# Patient Record
Sex: Male | Born: 1945 | Race: White | Hispanic: No | Marital: Married | Smoking: Former smoker
Health system: Southern US, Community
[De-identification: ages and names within clinical notes are randomized; demographics above are authoritative.]

## PROBLEM LIST (undated history)

## (undated) DIAGNOSIS — R112 Nausea with vomiting, unspecified: Secondary | ICD-10-CM

## (undated) DIAGNOSIS — R569 Unspecified convulsions: Secondary | ICD-10-CM

## (undated) DIAGNOSIS — Z8719 Personal history of other diseases of the digestive system: Secondary | ICD-10-CM

## (undated) DIAGNOSIS — K529 Noninfective gastroenteritis and colitis, unspecified: Secondary | ICD-10-CM

## (undated) DIAGNOSIS — G40309 Generalized idiopathic epilepsy and epileptic syndromes, not intractable, without status epilepticus: Secondary | ICD-10-CM

## (undated) HISTORY — DX: Nausea with vomiting, unspecified: R11.2

## (undated) HISTORY — DX: Generalized idiopathic epilepsy and epileptic syndromes, not intractable, without status epilepticus: G40.309

## (undated) HISTORY — DX: Personal history of other diseases of the digestive system: Z87.19

---

## 2005-09-07 ENCOUNTER — Encounter: Payer: Self-pay | Admitting: Internal Medicine

## 2005-09-30 ENCOUNTER — Encounter: Payer: Self-pay | Admitting: Internal Medicine

## 2006-12-31 ENCOUNTER — Emergency Department: Payer: Self-pay

## 2007-08-01 ENCOUNTER — Emergency Department (HOSPITAL_COMMUNITY): Admission: EM | Admit: 2007-08-01 | Discharge: 2007-08-01 | Payer: Self-pay | Admitting: Emergency Medicine

## 2007-09-22 ENCOUNTER — Ambulatory Visit: Payer: Self-pay | Admitting: Internal Medicine

## 2008-01-22 ENCOUNTER — Ambulatory Visit: Payer: Self-pay | Admitting: Orthopedic Surgery

## 2008-08-18 ENCOUNTER — Ambulatory Visit: Payer: Self-pay | Admitting: Internal Medicine

## 2008-08-18 ENCOUNTER — Emergency Department: Payer: Self-pay | Admitting: Emergency Medicine

## 2009-03-03 ENCOUNTER — Ambulatory Visit: Payer: Self-pay | Admitting: Internal Medicine

## 2009-05-27 ENCOUNTER — Ambulatory Visit: Payer: Self-pay | Admitting: Neurology

## 2010-01-10 ENCOUNTER — Ambulatory Visit: Payer: Self-pay | Admitting: Internal Medicine

## 2010-04-05 ENCOUNTER — Ambulatory Visit: Payer: Self-pay | Admitting: Neurology

## 2010-08-15 NOTE — Op Note (Signed)
NAME:  Nicholas Rowe, Nicholas Rowe NO.:  [id number]   MEDICAL RECORD NO.:  [id number]          PATIENT TYPE:  EMS   LOCATION:  MAJO                         FACILITY:  [facility]   PHYSICIAN:  John C. Madilyn Fireman, M.D.    DATE OF BIRTH:  10/12/1945   DATE OF PROCEDURE:  08/01/2007  DATE OF DISCHARGE:  08/01/2007                               OPERATIVE REPORT   INDICATIONS FOR PROCEDURE:  Obstructed esophagus after eating chicken,  confirmed by Gastrografin swallow.   PROCEDURE:  The patient was placed in the left lateral decubitus  position and placed on the pulse monitor with continuous low-flow oxygen  delivered by nasal cannula.  He was sedated with 87.5 mcg IV fentanyl  and 9 mg IV Versed.  The Olympus video endoscope was advanced under  direct vision to the oropharynx and esophagus.  There was thick viscous  fluid in the esophagus, and a lot of particulate matter that made it  very difficult to suction liquid due to the particulate matter clogging  the scope.  Initially, the distal esophagus could not be well seen.  We  used a [employer] and a loop snare to remove several fragments of food  bolus which appeared consistent with the chicken he had eaten.  Several  retrievals and reintubations were acquired.  Finally, I was able to push  the remaining bolus through the esophagus leaving and a friable,  bloodied lower esophageal ring with the main portion of the food bolus  pushed into the stomach.  The scope was then withdrawn, and the patient  returned to the recovery room in stable condition.  He tolerated the  procedure well, and there were no immediate complications.   IMPRESSION:  Esophageal obstruction from food bolus removed  endoscopically.   PLAN:  PPI therapy followed by elective esophageal dilatation in the  future.           ______________________________  Everardo All Madilyn Fireman, M.D.     JCH/MEDQ  D:  08/01/2007  T:  08/01/2007  Job:  [id number]

## 2011-11-14 ENCOUNTER — Ambulatory Visit: Payer: Self-pay | Admitting: Family Medicine

## 2011-11-15 ENCOUNTER — Emergency Department: Payer: Self-pay | Admitting: Emergency Medicine

## 2011-11-15 LAB — CBC
HCT: 41.1 % (ref 40.0–52.0)
Platelet: 192 10*3/uL (ref 150–440)
RBC: 4.56 10*6/uL (ref 4.40–5.90)
RDW: 13.4 % (ref 11.5–14.5)
WBC: 8.6 10*3/uL (ref 3.8–10.6)

## 2011-11-15 LAB — TROPONIN I: Troponin-I: <0.02 ng/mL

## 2011-11-15 LAB — BASIC METABOLIC PANEL
Anion Gap: 8 (ref 7–16)
Chloride: 99 mmol/L (ref 98–107)
Co2: 26 mmol/L (ref 21–32)
Creatinine: 0.65 mg/dL (ref 0.60–1.30)
Osmolality: 265 (ref 275–301)

## 2011-11-15 LAB — PROTIME-INR
INR: 0.9
Prothrombin Time: 12.3 secs (ref 11.5–14.7)

## 2014-03-24 ENCOUNTER — Ambulatory Visit: Payer: Self-pay | Admitting: Family Medicine

## 2014-06-02 ENCOUNTER — Inpatient Hospital Stay: Payer: Self-pay | Admitting: Specialist

## 2014-06-04 ENCOUNTER — Ambulatory Visit: Admit: 2014-06-04 | Disposition: A | Discharge status: Home / Self Care | Payer: Self-pay | Admitting: Neurology

## 2014-06-07 DIAGNOSIS — I361 Nonrheumatic tricuspid (valve) insufficiency: Secondary | ICD-10-CM

## 2014-06-10 ENCOUNTER — Other Ambulatory Visit: Payer: Self-pay | Admitting: Physician Assistant

## 2014-06-18 DIAGNOSIS — G40309 Generalized idiopathic epilepsy and epileptic syndromes, not intractable, without status epilepticus: Secondary | ICD-10-CM

## 2014-06-18 HISTORY — DX: Generalized idiopathic epilepsy and epileptic syndromes, not intractable, without status epilepticus: G40.309

## 2014-08-01 NOTE — Discharge Summary (Signed)
PATIENT NAME:  Nicholas Rowe, Colston M MR#:  [id number] DATE OF BIRTH:  1945-08-22  DATE OF ADMISSION:  06/02/2014 DATE OF DISCHARGE:  06/12/2014  For a detailed note, please take a look at the history and physical done on admission by Dr. Altamese DillingVaibhavkumar Vachhani. Please also look at the interim discharge summary done by Dr. Auburn BilberryShreyang Patel, which covers extensive part of the hospital course from 06/02/2014 to 06/10/2014. This is a short interim course from 06/10/2014 to 06/12/2014.   DIAGNOSES AT DISCHARGE: Status epilepticus now resolved, acute respiratory failure secondary to possible aspiration pneumonia, elevated troponin secondary to demand ischemia, history of intracranial hemorrhage.   CONSULTANTS DURING THE HOSPITAL COURSE: Dr. Loretha BrasilZeylikman from neurology, Dr. Belia HemanKasa from pulmonary critical care.   The patient is being discharged on a regular diet.   ACTIVITY: As tolerated.   FOLLOWUP: With Dr. Barry BrunnerGlenn Willett in the next 1 to 2 weeks.   DISCHARGE MEDICATIONS: Dilantin 300 mg at bedtime, Keppra 750 mg 2 tabs b.i.d., Augmentin 5 mg b.i.d. x7 days, metoprolol tartrate 25 mg b.i.d., Valium 2 mg b.i.d. x3 days and Valium 2 mg daily for 3 days, then discontinue, prednisone taper starting at 30 mg down to 10 mg over the next 3 days.   PERTINENT STUDIES DONE DURING THE INTERIM HOSPITAL COURSE: None.   BRIEF HOSPITAL COURSE: This is a 69 year old male with medical problems as mentioned above, presented to the hospital due to continuous seizures and noted to be in status epilepticus.   1. Status epilepticus. The patient has a known history of seizure disorder and a history of intracranial bleed. The patient was seen by neurology. He was loaded with Keppra and Dilantin. He is currently on oral Keppra and Dilantin and is stable on that dose. He is being discharged back on his antiepileptics.  2. Acute respiratory failure. This was thought to be secondary to aspiration pneumonia from his seizures. Initially, the  patient was on the ventilator, weaned off the BiPAP, then on high flow oxygen. He is currently on room air, doing well. He was on broad-spectrum IV antibiotics and then on IV Unasyn and therefore, currently being discharged on oral Augmentin. He was also noted to be bronchospastic. Therefore, was on IV steroids, currently weaning to prednisone taper, which he will finish upon discharge.  3. Elevated troponins. This is likely secondary to demand ischemia. His echocardiogram showed normal ejection fraction with no wall motion abnormalities.  4. Hypertension. The patient was discharged on low-dose metoprolol.  5. Hypokalemia. This has since then been replaced and resolved now.   CODE STATUS: The patient is a full code.   DISPOSITION: He was discharged home.   TIME SPENT: 40 minutes.  ____________________________ Rolly PancakeVivek J. Cherlynn KaiserSainani, MD vjs:ap D: 06/12/2014 14:05:01 ET T: 06/12/2014 18:35:36 ET JOB#: [id number]  cc: Rolly PancakeVivek J. Cherlynn KaiserSainani, MD, <Dictator> Houston SirenVIVEK J Ewing Fandino MD ELECTRONICALLY SIGNED 06/14/2014 14:39

## 2014-08-01 NOTE — H&P (Signed)
PATIENT NAME:  Nicholas Rowe, Nicholas Rowe MR#:  [id number] DATE OF BIRTH:  [birth year]  DATE OF ADMISSION:  06/02/2014  [facility] wife.    REFERRING EMERGENCY ROOM PHYSICIAN:  Onnie BoerKevin A Paduchowski, MD   CHIEF COMPLAINT: Status epilepticus; continuous seizure.   HISTORY OF PRESENT ILLNESS: A 69 year old male who is brought in by EMS for persistent seizures for 15 minutes. EMS gave IM benzodiazepines which helped to control the seizures, but in ER, again had abnormal movement and vomited and remained drowsy, so intubated by ER physician, started on IV versed drip and loaded with IV Keppra, and given for admission to hospitalist team.   The patient is intubated during my examination and he is sedated so unable to give me any history. I called his wife and on the phone got all the history about patient. As per her at age of 69 years, patient had intracranial hemorrhage and since then he has seizure disorder. Initially he was on Dilantin, but was not able to control well, so was also started on Dilantin plus Keppra. He  still continues to have seizures with minimal stress like sleeplessness, or stressing out, or working out a little bit extra; or sometimes even without any reasons, but otherwise he is very active and healthy, has some memory issues since he had intracranial bleed, mainly affecting his short term memory, but otherwise he is completely healthy and does not have any other medical issues and lives completely independent life with his family. Wife, denies him missing any tablets or having any stress within last 1 or 2 days.   REVIEW OF SYSTEMS:  Not able to get it, as the patient is intubated on a ventilator machine.   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   SOCIAL HISTORY: No smoking. No drinking alcohol, lives with family; no illegal drug use, independent in day-to-day activity.   FAMILY  HISTORY: Negative for seizures.   HOME MEDICATIONS:   1.  Keppra extended release 750 mg oral 2 tablets 2 times a day.  2.  Dilantin 100 mg oral extended release 3 capsules once a day.   PHYSICAL EXAMINATION: VITALS: In ER, temperature 98.7, pulse is 113, respirations 22; also went up to 33, at a time in the ER, blood pressure 134/76, and pulse oximetry is 97% on ventilator right now.  GENERAL: The patient is dishevelled, he is sedated on the ventilator; endotracheal intubation is done.  HEENT: Head and neck atraumatic, pupils equally reactive to light, conjunctivae are pink, sclerae anicteric, oral mucosa appears moist.  NECK: Supple. No JVD. No use of accessory muscles of respiration, no wheezing or crepitation heard.  CARDIOVASCULAR: S1, S2 present, regular, no murmur appreciated.  ABDOMEN: Soft, nontender bowel sounds present, no organomegaly.  SKIN: No acne, rashes, or lesions, well-hydrated.  JOINTS: No swelling or tenderness.  NEUROLOGICAL: Currently intubated on ventilator support and sedated. No rigidity or abnormal movements noted; reflexes preserved.   IMPORTANT DIAGNOSTIC DATA  RESULTS:  1.  Blood glucose level 150, BUN 12, creatinine 1.05, sodium 134, potassium 3.4, chloride 98, CO2 of 21, and calcium is 8.1.  2.  Total protein 7.8, albumin 3.9, bilirubin 0.1, alkaline phosphate 105, SGOT 39, SGPT 29.  3.  Troponin 0.09.  4.  Urine for toxicology is positive for benzodiazepine and positive for cannabinoid.  5.  WBC count is 11.6, hemoglobin 15.2, platelet count is 274,000.  6.  Urinalysis  is negative.  7.  On ABG pH is 7.24, pCO2 of 54, and pO2 is 100, with 50% FiO2.  8.  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  9.  Chest x-ray portable shows satisfactory endotracheal tube position, no acute infiltrate.   ASSESSMENT AND PLAN: A 69 year old male who has history of intracranial  hemorrhage and since then having seizures, came to Emergency Room with status epilepticus and had vomiting and abnormal movement in the ER, so intubated and started on ventilator and Versed drip, given to hospitalist team for further management.  1.  Status epilepticus. Currently, he is loaded with IV Keppra and started on ventilator with IV midazolam drip. We will continue monitoring in critical care unit, get neurology and pulmonary consult to help with vent management and his seizures. We will also check his Keppra and Dilantin level, but as per wife he was still having seizures even though he was taking medication regularly in between and his neurologist told that he might continue doing that because of history of injuries to the brain, secondary to bleed.  2.  Elevated troponin 0.09. This might be just because of stress of seizures. I will not do any further work-up about this.   Total time spent on this admission was 50 minutes critical care time.   I spoke to the patient's wife on the phone to get further information about the patient and informed her about plan and management, she agrees with that.    ____________________________ Hope Pigeon. Elisabeth Pigeon, MD vgv:nt D: 06/02/2014 22:06:49 ET T: 06/02/2014 22:26:08 ET JOB#: [id number]  cc: Hope Pigeon. Elisabeth Pigeon, MD, <Dictator> Altamese Dilling MD ELECTRONICALLY SIGNED 06/22/2014 12:15

## 2014-08-01 NOTE — Consult Note (Signed)
Referring Physician:  Vaughan Basta :   Primary Care Physician:   Vaughan Basta [facility], [address] [place], [place] [address], [place] [id number]  Reason for Consult: Admit Date: 02-Jun-2014  Chief Complaint: seizures  Reason for Consult: seizure   History of Present Illness: History of Present Illness:   seen at request of Dr. Mortimer Fries for seizures;  70 yo RHD M presents to [facility] secondary to multiple seizures.  Pt has past hx of seizures after having an IPH.  He was seen in the ER and immediately intubated for seizure activity.  He apparently had multiple seizures which finally stopped after ativan and intubation.  No more seizures after that.  No other abnormalities noted per nursing.  ROS:  Review of Systems   unobtainable secondary to mental status  Past Medical/Surgical Hx:  migraines:   Seizures:   CVA/Stroke:   Past Medical/ Surgical Hx:  Past Medical History IPH, seizures   Past Surgical History none   Home Medications: Medication Instructions Last Modified Date/Time  Dilantin 100 mg oral capsule, extended release 3 cap(s) orally once a day (at lunch time) 02-Mar-16 20:02  Keppra XR 750 mg oral tablet, extended release 2 tab(s) orally 2 times a day 02-Mar-16 20:02   Allergies:  No Known Allergies:   Allergies:  Allergies NKDA    Social/Family History: Lives With: alone  Living Arrangements: apartment  Social History: no tob, occasional EtOH, no illicits  Family History: no seizures, no strokes   Vital Signs: **Vital Signs.:   03-Mar-16 08:00  Vital Signs Type Routine  Pulse Pulse 99  Respirations Respirations 27  Systolic BP Systolic BP 258  Diastolic BP (mmHg) Diastolic BP (mmHg) 63  Mean BP 75  Pulse Ox % Pulse Ox % 99  Pulse Ox Activity Level  At rest  Oxygen Delivery Ventilator Assisted   Physical Exam: General: nl weight, moderate distress  HEENT: normocephalic, sclera nonicteric, oropharynx clear  Neck:  supple, no JVD, no bruits  Chest: CTA B, no wheezing  Cardiac: RRR, no murmurs, no edema, 2+ pulses  Extremities: no C/C/E, FROM   Neurologic Exam: Mental Status: intubated, sedated, opens to pain but does not track or follow, GCS 8T  Cranial Nerves: PERRLA, EOMI, nl VF, face symmetric, tongue midline, shoulder shrug equal  Motor Exam: R hemiparesis, nl tone  Deep Tendon Reflexes: 1+/4 B, babinski on R, plantars downgoing on L  Sensory Exam: grimaces to pain in all 4 ext  Coordination: untestable   Lab Results:  Hepatic:  02-Mar-16 18:42   Bilirubin, Total  0.1  Alkaline Phosphatase 105  SGPT (ALT) 29  SGOT (AST)  39  Total Protein, Serum 7.8  Albumin, Serum 3.9  TDMs:  02-Mar-16 18:42   Dilantin, Serum 12.4 (Result(s) reported on 02 Jun 2014 at 10:18PM.)  Routine Chem:  02-Mar-16 18:42   Result Comment TROPONIN - RESULTS VERIFIED BY REPEAT TESTING.  - [facility] CALLED SYLVIA URGILES @ 1956 06/02/14  - READ-BACK PROCESS PERFORMED.  Result(s) reported on 02 Jun 2014 at 08:01PM.  Ethanol, S. < 3 (Result(s) reported on 02 Jun 2014 at 07:46PM.)  03-Mar-16 04:34   Magnesium, Serum 1.8 (1.8-2.4 THERAPEUTIC RANGE: 4-7 mg/dL TOXIC: > 10 mg/dL  -----------------------)  Glucose, Serum  110  BUN 13  Creatinine (comp) 0.88  Sodium, Serum 136  Potassium, Serum 3.7  Chloride, Serum 103  CO2, Serum 26  Calcium (Total), Serum  7.4  Anion Gap 7  Osmolality (calc) 273  eGFR (African  American) >60  eGFR (Non-African American) >60 (eGFR values <38mL/min/1.73 m2 may be an indication of chronic kidney disease (CKD). Calculated eGFR, using the MRDR Study equation, is useful in  patients with stable renal function. The eGFR calculation will not be reliable in acutely ill patients when serum creatinine is changing rapidly. It is not useful in patients on dialysis. The eGFR calculation may not be applicable to patients at the low and high extremes of body sizes, pregnant women, and  vegetarians.)  Urine Drugs:  78-EUM-35 36:14   Tricyclic Antidepressant, Ur Qual (comp) NEGATIVE (Result(s) reported on 02 Jun 2014 at 08:51PM.)  Amphetamines, Urine Qual. NEGATIVE  MDMA, Urine Qual. NEGATIVE  Cocaine Metabolite, Urine Qual. NEGATIVE  Opiate, Urine qual NEGATIVE  Phencyclidine, Urine Qual. NEGATIVE  Cannabinoid, Urine Qual. POSITIVE  Barbiturates, Urine Qual. NEGATIVE  Benzodiazepine, Urine Qual. POSITIVE (----------------- The URINE DRUG SCREEN provides only a preliminary, unconfirmed analytical test result and should not be used for non-medical  purposes.  Clinical consideration and professional judgment should be  applied to any positive drug screen result due to possible interfering substances.  A more specific alternate chemical method must be used in order to obtain a confirmed analytical result.  Gas chromatography/mass spectrometry (GC/MS) is the preferred confirmatory method.)  Methadone, Urine Qual. NEGATIVE  Cardiac:  02-Mar-16 18:42   Troponin I  0.09 (0.00-0.05 0.05 ng/mL or less: NEGATIVE  Repeat testing in 3-6 hrs  if clinically indicated. >0.05 ng/mL: POTENTIAL  MYOCARDIAL INJURY. Repeat  testing in 3-6 hrs if  clinically indicated. NOTE: An increase or decrease  of 30% or more on serial  testing suggests a  clinically important change)  Routine UA:  02-Mar-16 20:07   Color (UA) Yellow  Clarity (UA) Hazy  Glucose (UA) Negative  Bilirubin (UA) Negative  Ketones (UA) Negative  Specific Gravity (UA) 1.012  Blood (UA) 2+  pH (UA) 5.0  Protein (UA) 100 mg/dL  Nitrite (UA) Negative  Leukocyte Esterase (UA) Negative (Result(s) reported on 02 Jun 2014 at 08:47PM.)  RBC (UA) 13 /HPF  WBC (UA) 3 /HPF  Bacteria (UA) NONE SEEN  Epithelial Cells (UA) 1 /HPF  Mucous (UA) PRESENT  Hyaline Cast (UA) 5 /LPF (Result(s) reported on 02 Jun 2014 at 08:47PM.)  Routine Hem:  03-Mar-16 04:34   WBC (CBC) 8.3  RBC (CBC)  4.33  Hemoglobin (CBC) 13.4   Hematocrit (CBC)  39.6  Platelet Count (CBC) 172  MCV 92  MCH 30.9  MCHC 33.7  RDW 13.1  Neutrophil % 86.7  Lymphocyte % 7.1  Monocyte % 5.5  Eosinophil % 0.3  Basophil % 0.4  Neutrophil #  7.2  Lymphocyte #  0.6  Monocyte # 0.5  Eosinophil # 0.0  Basophil # 0.0 (Result(s) reported on 03 Jun 2014 at 05:07AM.)   Radiology Results: CT:    02-Mar-16 20:10, CT Head Without Contrast  CT Head Without Contrast   REASON FOR EXAM:    Seizures with decreased RT sided movement  COMMENTS:       PROCEDURE: CT  - CT HEAD WITHOUT CONTRAST  - Jun 02 2014  8:10PM     CLINICAL DATA:  Seizure and unresponsive.    EXAM:  CT HEAD WITHOUT CONTRAST    TECHNIQUE:  Contiguousaxial images were obtained from the base of the skull  through the vertex without intravenous contrast.    COMPARISON:  11/15/2011  FINDINGS:  Stable appearance by CT of old left posterior parieto-occipital  infarct and old  deep white matter infarct posterior to the left  lateral ventricle in the parietal lobe. The brain demonstrates no  evidence of hemorrhage, acute infarction, edema, mass effect,  extra-axial fluid collection, hydrocephalus or mass lesion. The  skull is unremarkable.     IMPRESSION:  Stable head CT demonstrating no acute findings. 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      Electronically Signed    By: Aletta Edouard M.D.    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 stroke   Impression/Recommendations: Recommendations:   prior notes reviewed by me  reviewed by me   Status epilepticus-  appears controlled now, no obvious provoking factor IPH-  resolved load Dilantin 553m IV x 1 then continue home dose add Keppra 1gm BID look for infections keep Mg > 2, Ca > 8 and Na > 130 wean sedation as tolerated will  follow  Electronic Signatures: SJamison Neighbor(MD)  (Signed 04-Mar-16 04:59)  Authored: REFERRING PHYSICIAN, Primary Care Physician, Consult, History of Present Illness, Review of Systems, PAST MEDICAL/SURGICAL HISTORY, HOME MEDICATIONS, ALLERGIES, Social/Family History, NURSING VITAL SIGNS, Physical Exam-, LAB RESULTS, RADIOLOGY RESULTS, Recommendations   Last Updated: 04-Mar-16 04:59 by SJamison Neighbor(MD)

## 2014-10-01 DIAGNOSIS — Z8719 Personal history of other diseases of the digestive system: Secondary | ICD-10-CM

## 2014-10-01 HISTORY — DX: Personal history of other diseases of the digestive system: Z87.19

## 2014-11-25 ENCOUNTER — Other Ambulatory Visit
Admission: RE | Admit: 2014-11-25 | Discharge: 2014-11-25 | Disposition: A | Discharge status: Home / Self Care | Payer: 59 | Source: Ambulatory Visit | Attending: Neurology | Admitting: Neurology

## 2014-11-25 DIAGNOSIS — Z79899 Other long term (current) drug therapy: Secondary | ICD-10-CM | POA: Diagnosis present

## 2014-11-25 DIAGNOSIS — G40219 Localization-related (focal) (partial) symptomatic epilepsy and epileptic syndromes with complex partial seizures, intractable, without status epilepticus: Secondary | ICD-10-CM | POA: Diagnosis not present

## 2014-11-25 LAB — PHENYTOIN LEVEL, TOTAL: PHENYTOIN LVL: 14.6 ug/mL (ref 10.0–20.0)

## 2014-12-26 ENCOUNTER — Emergency Department
Admission: EM | Admit: 2014-12-26 | Discharge: 2014-12-26 | Disposition: A | Discharge status: Home / Self Care | Payer: 59 | Attending: Student | Admitting: Student

## 2014-12-26 ENCOUNTER — Emergency Department: Payer: 59

## 2014-12-26 ENCOUNTER — Encounter: Payer: Self-pay | Admitting: Emergency Medicine

## 2014-12-26 DIAGNOSIS — M545 Low back pain, unspecified: Secondary | ICD-10-CM

## 2014-12-26 DIAGNOSIS — R109 Unspecified abdominal pain: Secondary | ICD-10-CM | POA: Diagnosis not present

## 2014-12-26 DIAGNOSIS — R319 Hematuria, unspecified: Secondary | ICD-10-CM | POA: Insufficient documentation

## 2014-12-26 HISTORY — DX: Unspecified convulsions: R56.9

## 2014-12-26 HISTORY — DX: Noninfective gastroenteritis and colitis, unspecified: K52.9

## 2014-12-26 LAB — URINALYSIS COMPLETE WITH MICROSCOPIC (ARMC ONLY)
Bacteria, UA: NONE SEEN
Bilirubin Urine: NEGATIVE
GLUCOSE, UA: NEGATIVE mg/dL
KETONES UR: NEGATIVE mg/dL
Leukocytes, UA: NEGATIVE
NITRITE: NEGATIVE
Protein, ur: NEGATIVE mg/dL
SPECIFIC GRAVITY, URINE: 1.021 (ref 1.005–1.030)
pH: 7 (ref 5.0–8.0)

## 2014-12-26 MED ORDER — OXYCODONE-ACETAMINOPHEN 5-325 MG PO TABS
1.0000 | ORAL_TABLET | Freq: Once | ORAL | Status: AC
  Administered 2014-12-26: 1 via ORAL
  Filled 2014-12-26: qty 1

## 2014-12-26 MED ORDER — OXYCODONE-ACETAMINOPHEN 5-325 MG PO TABS: 1.0000 | ORAL_TABLET | ORAL | Status: DC | PRN

## 2014-12-26 NOTE — ED Notes (Signed)
Right side lower back pain since yesterday   Denies any urinary sxs'

## 2014-12-26 NOTE — ED Provider Notes (Signed)
[facility] Emergency Department Provider Note  ____________________________________________  Time seen: Approximately 10:44 AM  I have reviewed the triage vital signs and the nursing notes.   HISTORY  Chief Complaint Back Pain   HPI Nicholas Rowe is a 69 y.o. male is here with complaint of right lower back pain since yesterday. Patient denies any urinary symptoms and denies any injury to his back. He denies any paresthesias to his lower extremities. He has not taken any over-the-counter medication. He is not aware of any hematuria and denies any previous kidney stones. There is positive family history of kidney stones. Patient states that he is not have pain to touch but "it's inside". He denies any radiation into the groin area. He currently rates his pain as 6/10.   Past Medical History  Diagnosis Date  . Seizures   . Colitis     There are no active problems to display for this patient.   History reviewed. No pertinent past surgical history.  Current Outpatient Rx  Name  Route  Sig  Dispense  Refill  . oxyCODONE-acetaminophen (PERCOCET) 5-325 MG per tablet   Oral   Take 1 tablet by mouth every 4 (four) hours as needed for severe pain.   20 tablet   0     Allergies Review of patient's allergies indicates no known allergies.  No family history on file.  Social History Social History  Substance Use Topics  . Smoking status: Never Smoker   . Smokeless tobacco: None  . Alcohol Use: No    Review of Systems Constitutional: No fever/chills ENT: No sore throat. Cardiovascular: Denies chest pain. Respiratory: Denies shortness of breath. Gastrointestinal: No abdominal pain.  No nausea, no vomiting.   Genitourinary: Negative for dysuria. Musculoskeletal: Positive for back pain. Skin: Negative for rash. Neurological: Negative for headaches, focal weakness or numbness.  10-point ROS otherwise  negative.  ____________________________________________   PHYSICAL EXAM:  VITAL SIGNS: ED Triage Vitals  Enc Vitals Group     BP 12/26/14 1012 143/76 mmHg     Pulse Rate 12/26/14 1012 62     Resp --      Temp --      Temp src --      SpO2 12/26/14 1012 97 %     Weight 12/26/14 1012 168 lb (76.204 kg)     Height 12/26/14 1012  (1.651 m)     Head Cir --      Peak Flow --      Pain Score --      Pain Loc --      Pain Edu? --      Excl. in GC? --     Constitutional: Alert and oriented. Well appearing and in no acute distress. Eyes: Conjunctivae are normal. PERRL. EOMI. Head: Atraumatic. Nose: No congestion/rhinnorhea. Neck: No stridor.   Cardiovascular: Normal rate, regular rhythm. Grossly normal heart sounds.  Good peripheral circulation. Respiratory: Normal respiratory effort.  No retractions. Lungs CTAB. Gastrointestinal: Soft and nontender. No distention.  No CVA tenderness. Musculoskeletal: No lower extremity tenderness nor edema.  No joint effusions. Neurologic:  Normal speech and language. No gross focal neurologic deficits are appreciated. No gait instability. Skin:  Skin is warm, dry and intact. No rash noted. Psychiatric: Mood and affect are normal. Speech and behavior are normal.  ____________________________________________   LABS (all labs ordered are listed, but only abnormal results are displayed)  Labs Reviewed  URINALYSIS COMPLETEWITH MICROSCOPIC ([facility] ONLY) - Abnormal; Notable  for the following:    Color, Urine YELLOW (*)    APPearance CLEAR (*)    Hgb urine dipstick 1+ (*)    Squamous Epithelial / LPF 0-5 (*)    All other components within normal limits    RADIOLOGY  CT scan shows a 10 mm cyst on the lateral right lower kidney. However no stones were seen. There is some irregularity of the anterior wall of the bladder. ____________________________________________   PROCEDURES  Procedure(s) performed: None  Critical Care performed:  No  ____________________________________________   INITIAL IMPRESSION / ASSESSMENT AND PLAN / ED COURSE  Pertinent labs & imaging results that were available during my care of the patient were reviewed by me and considered in my medical decision making (see chart for details).  Patient was given the name of urologist on call to make an appointment of follow-up. He is aware he does not have kidney stones but was made aware that he does have a cyst on his kidney. He'll also follow-up with his regular doctor at [facility] for back pain. Prior discharge patient states that medication given to him in the emergency room has helped with his pain a great deal. He was discharged on Percocet 5 mg one every 4-6 hours when necessary pain. He is also encouraged to drink more fluids. ____________________________________________   FINAL CLINICAL IMPRESSION(S) / ED DIAGNOSES  Final diagnoses:  Right flank pain  Hematuria  Right-sided low back pain without sciatica      Tommi Rumps, PA-C 12/26/14 1230  Gayla Doss, MD 12/26/14 1520

## 2014-12-26 NOTE — Discharge Instructions (Signed)
° °  Follow-up with your doctor at [facility] clinic and also make an appointment with the urologist for further tests. Take medication as directed.

## 2015-01-20 ENCOUNTER — Ambulatory Visit (INDEPENDENT_AMBULATORY_CARE_PROVIDER_SITE_OTHER): Payer: 59 | Admitting: Urology

## 2015-01-20 ENCOUNTER — Encounter: Payer: Self-pay | Admitting: Urology

## 2015-01-20 VITALS — Ht 67.0 in | Wt 171.0 lb

## 2015-01-20 DIAGNOSIS — N281 Cyst of kidney, acquired: Secondary | ICD-10-CM

## 2015-01-20 DIAGNOSIS — Q6100 Congenital renal cyst, unspecified: Secondary | ICD-10-CM | POA: Diagnosis not present

## 2015-01-20 DIAGNOSIS — R3129 Other microscopic hematuria: Secondary | ICD-10-CM

## 2015-01-20 NOTE — Progress Notes (Signed)
01/20/2015 12:41 PM   Genene Churn Brizuela 07-Nov-1945 [id number]  Referring provider: Lonell Face, MD [phone] [facility] [address] [facility] [place], [place] [address]  Chief Complaint  Patient presents with  . Establish Care    Renal cyst    HPI:  the patient is a 69 year old woman originally went to the hospital for flank pain at the time he was diagnosed with a 1 cm renal cyst. He presents to clinic today for follow-up. Also of note, he had 6-30 red blood cells on his urinalysis in the emergency part. He denies any other urinary symptoms with this time.  He is never seen blood in his urine. He denies nocturia,frequency, urgency and weak stream.    PMH: Past Medical History  Diagnosis Date  . Seizures (HCC)   . Colitis   . H/O chronic ulcerative colitis 10/01/2014  . 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  Home Medications:    Medication List       This list is accurate as of: 01/20/15 12:41 PM.  Always use your most recent med list.               donepezil 10 MG tablet  Commonly known as:  ARICEPT  Take by mouth.     KEPPRA XR 750 MG Tb24  Generic drug:  Levetiracetam  Take by mouth.     metoprolol tartrate 25 MG tablet  Commonly known as:  LOPRESSOR  Take by mouth.     MULTI-VITAMINS Tabs  Take by mouth.     oxyCODONE-acetaminophen 5-325 MG tablet  Commonly known as:  PERCOCET  Take 1 tablet by mouth every 4 (four) hours as needed for severe pain.     phenytoin 100 MG ER capsule  Commonly known as:  DILANTIN  Take by mouth.     Vitamin D (Ergocalciferol) 50000 UNITS Caps capsule  Commonly known as:  DRISDOL  Take by mouth.        Allergies: No Known Allergies  Family History: Family History  Problem Relation Age of Onset  . Hematuria Neg Hx   . Kidney cancer Neg Hx   . Bladder Cancer Neg Hx     Social History:  reports that he quit smoking about 29 years  ago. He does not have any smokeless tobacco history on file. He reports that he does not drink alcohol. His drug history is not on file.  ROS: UROLOGY Frequent Urination?: No Hard to postpone urination?: No Burning/pain with urination?: No Get up at night to urinate?: Yes Leakage of urine?: Yes Urine stream starts and stops?: No Trouble starting stream?: No Do you have to strain to urinate?: No Blood in urine?: No Urinary tract infection?: No Sexually transmitted disease?: No Injury to kidneys or bladder?: No Painful intercourse?: No Weak stream?: No Erection problems?: No Penile pain?: No  Gastrointestinal Nausea?: No Vomiting?: No Indigestion/heartburn?: No Diarrhea?: No Constipation?: No  Constitutional Fever: No Night sweats?: No Weight loss?: No Fatigue?: No  Skin Skin rash/lesions?: No Itching?: No  Eyes Blurred vision?: No Double vision?: No  Ears/Nose/Throat Sore throat?: No Sinus problems?: No  Hematologic/Lymphatic Swollen glands?: No Easy bruising?: No  Cardiovascular Leg swelling?: No Chest pain?: No  Respiratory Cough?: No Shortness of breath?: No  Endocrine Excessive thirst?: No  Musculoskeletal Back pain?: Yes Joint pain?: Yes  Neurological Headaches?: No Dizziness?: No  Psychologic Depression?: No Anxiety?: No  Physical Exam: Ht 5'  7" (1.702 m)  Wt 171 lb (77.565 kg)  BMI 26.78 kg/m2  Constitutional:  Alert and oriented, No acute distress. HEENT: [place] AT, moist mucus membranes.  Trachea midline, no masses. Cardiovascular: No clubbing, cyanosis, or edema. Respiratory: Normal respiratory effort, no increased work of breathing. GI: Abdomen is soft, nontender, nondistended, no abdominal masses GU: No CVA tenderness.  Normal phallus. Testicles and equal bilaterally nontender to palpation. No masses. Skin: No rashes, bruises or suspicious lesions. Lymph: No cervical or inguinal adenopathy. Neurologic: Grossly intact, no  focal deficits, moving all 4 extremities. Psychiatric: Normal mood and affect.  Laboratory Data: Lab Results  Component Value Date   WBC 8.6 11/15/2011   HGB 14.5 11/15/2011   HCT 41.1 11/15/2011   MCV 90 11/15/2011   PLT 192 11/15/2011    Lab Results  Component Value Date   CREATININE 0.65 11/15/2011    No results found for: PSA  No results found for: TESTOSTERONE  No results found for: HGBA1C  Urinalysis    Component Value Date/Time   COLORURINE YELLOW* 12/26/2014 1116   APPEARANCEUR CLEAR* 12/26/2014 1116   LABSPEC 1.021 12/26/2014 1116   PHURINE 7.0 12/26/2014 1116   GLUCOSEU NEGATIVE 12/26/2014 1116   HGBUR 1+* 12/26/2014 1116   BILIRUBINUR NEGATIVE 12/26/2014 1116   KETONESUR NEGATIVE 12/26/2014 1116   PROTEINUR NEGATIVE 12/26/2014 1116   NITRITE NEGATIVE 12/26/2014 1116   LEUKOCYTESUR NEGATIVE 12/26/2014 1116    Pertinent Imaging: CLINICAL DATA: Right flank pain, hematuria  EXAM: CT ABDOMEN AND PELVIS WITHOUT CONTRAST  TECHNIQUE: Multidetector CT imaging of the abdomen and pelvis was performed following the standard protocol without IV contrast.  COMPARISON: None.  FINDINGS: Lower chest: Lung bases are clear.  Hepatobiliary: Unenhanced liver is unremarkable.  Gallbladder is unremarkable. No intrahepatic or extrahepatic ductal dilatation.  Pancreas: Within normal limits.  Spleen: 2.3 cm rim calcified splenic cyst (series 2/ image 17), benign.  Adrenals/Urinary Tract: Adrenal glands are within normal limits.  10 mm cysts in the lateral right lower kidney (series 2/ image 42). Kidneys are otherwise within normal limits.  No renal, ureteral, or bladder calculi. No hydronephrosis.  Irregular anterior bladder wall thickening (series 2/ image 69).  Stomach/Bowel: Stomach is notable for a tiny hiatal hernia.  No evidence of bowel obstruction.  Normal appendix.  Vascular/Lymphatic: Atherosclerotic calcifications of the  abdominal aorta and branch vessels.  No suspicious abdominopelvic lymphadenopathy.  Reproductive: Prostatomegaly.  Other: No abdominopelvic ascites.  Musculoskeletal: Degenerative changes of the visualized thoracolumbar spine.  IMPRESSION: No renal, ureteral, or bladder calculi. No hydronephrosis.  Irregular anterior bladder wall thickening. In the setting of hematuria, consider cystoscopy for further evaluation.  No evidence of bowel obstruction. Normal appendix.  Prostatomegaly.   Assessment & Plan:     1. Microscopic hematuria   We will order CT urogram this time. He will follow up for cystoscopy after CT urogram.  We'll also obtain a basic metabolic panel at this time in preparation for the CT urogram.   2. Renal cyst   as above  3.  Prostate Cancer Screening  I also discussed with him the risks, benefits, and indications of PSA testing. He is not interested at this time. He will think about it and if he elects to perform prostate cancer screening, we will discuss it at his next visit.  Return in about 2 weeks (around 02/03/2015) for cystoscopy after CT Urogram.  Hildred LaserBrian Trasean Makari Portman, MD  [facility] [address], [address] [place], [place] [address] [phone]  [phone]   

## 2015-01-21 LAB — BASIC METABOLIC PANEL
BUN/Creatinine Ratio: 13 (ref 10–22)
BUN: 10 mg/dL (ref 8–27)
CALCIUM: 9.1 mg/dL (ref 8.6–10.2)
CHLORIDE: 91 mmol/L — AB (ref 97–106)
CO2: 20 mmol/L (ref 18–29)
Creatinine, Ser: 0.75 mg/dL — ABNORMAL LOW (ref 0.76–1.27)
GFR calc Af Amer: 110 mL/min/{1.73_m2} (ref >59)
GFR, EST NON AFRICAN AMERICAN: 95 mL/min/{1.73_m2} (ref >59)
Glucose: 73 mg/dL (ref 65–99)
POTASSIUM: 4.3 mmol/L (ref 3.5–5.2)
Sodium: 131 mmol/L — ABNORMAL LOW (ref 136–144)

## 2015-02-10 ENCOUNTER — Encounter: Payer: Self-pay | Admitting: Urology

## 2015-02-10 ENCOUNTER — Ambulatory Visit (INDEPENDENT_AMBULATORY_CARE_PROVIDER_SITE_OTHER): Payer: 59 | Admitting: Urology

## 2015-02-10 VITALS — BP 156/84 | HR 67 | Ht 66.0 in | Wt 167.1 lb

## 2015-02-10 DIAGNOSIS — R3129 Other microscopic hematuria: Secondary | ICD-10-CM

## 2015-02-10 LAB — MICROSCOPIC EXAMINATION
BACTERIA UA: NONE SEEN
EPITHELIAL CELLS (NON RENAL): NONE SEEN /HPF (ref 0–10)

## 2015-02-10 LAB — URINALYSIS, COMPLETE
BILIRUBIN UA: NEGATIVE
Glucose, UA: NEGATIVE
Ketones, UA: NEGATIVE
LEUKOCYTES UA: NEGATIVE
NITRITE UA: NEGATIVE
PH UA: 7 (ref 5.0–7.5)
Protein, UA: NEGATIVE
Specific Gravity, UA: 1.01 (ref 1.005–1.030)
Urobilinogen, Ur: 0.2 mg/dL (ref 0.2–1.0)

## 2015-02-10 MED ORDER — LIDOCAINE HCL 2 % EX GEL
1.0000 "application " | Freq: Once | CUTANEOUS | Status: AC
  Administered 2015-02-10: 1 via URETHRAL

## 2015-02-10 MED ORDER — CIPROFLOXACIN HCL 500 MG PO TABS
500.0000 mg | ORAL_TABLET | Freq: Once | ORAL | Status: AC
  Administered 2015-02-10: 500 mg via ORAL

## 2015-02-10 NOTE — Progress Notes (Signed)
02/10/2015 2:00 PM   Nicholas Rowe 10/05/1945 [id number]  Referring provider: Lonell FaceHemang K Shah, MD [phone] [facility] [address] [facility] [place], [place] [address]  Chief Complaint  Patient presents with  . Cysto    gross hematuria    HPI: The patient is a 69 year old woman originally went to the hospital for flank pain at the time he was diagnosed with a 1 cm renal cyst. He presents to clinic today for follow-up. Also of note, he had 6-30 red blood cells on his urinalysis in the emergency part. He denies any other urinary symptoms with this time. He is never seen blood in his urine. He denies nocturia,frequency, urgency and weak stream.  Interval History: The patient returns to complete his hematuria work up. He did not yet undergo his CT urogram. He has no change in his urinary status since his last visit. He has not seen any blood in his urine.   PMH: Past Medical History  Diagnosis Date  . Seizures (HCC)   . Colitis   . H/O chronic ulcerative colitis 10/01/2014  . 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  Home Medications:    Medication List       This list is accurate as of: 02/10/15  2:00 PM.  Always use your most recent med list.               donepezil 10 MG tablet  Commonly known as:  ARICEPT  Take by mouth.     KEPPRA XR 750 MG Tb24  Generic drug:  Levetiracetam  Take by mouth.     metoprolol tartrate 25 MG tablet  Commonly known as:  LOPRESSOR  Take by mouth.     MULTI-VITAMINS Tabs  Take by mouth.     oxyCODONE-acetaminophen 5-325 MG tablet  Commonly known as:  PERCOCET  Take 1 tablet by mouth every 4 (four) hours as needed for severe pain.     phenytoin 100 MG ER capsule  Commonly known as:  DILANTIN  Take by mouth.     Vitamin D (Ergocalciferol) 50000 UNITS Caps capsule  Commonly known as:  DRISDOL  Take by mouth.        Allergies: No Known  Allergies  Family History: Family History  Problem Relation Age of Onset  . Hematuria Neg Hx   . Kidney cancer Neg Hx   . Bladder Cancer Neg Hx     Social History:  reports that he quit smoking about 29 years ago. He does not have any smokeless tobacco history on file. He reports that he does not drink alcohol. His drug history is not on file.  ROS:                                        Physical Exam: BP 156/84 mmHg  Pulse 67  Ht 5\' 6"  (1.676 m)  Wt 167 lb 1.6 oz (75.796 kg)  BMI 26.98 kg/m2  Constitutional:  Alert and oriented, No acute distress. HEENT: [place] AT, moist mucus membranes.  Trachea midline, no masses. Cardiovascular: No clubbing, cyanosis, or edema. Respiratory: Normal respiratory effort, no increased work of breathing. GI: Abdomen is soft, nontender, nondistended, no abdominal masses GU: No CVA tenderness.   Skin: No rashes, bruises or suspicious lesions. Lymph: No cervical or inguinal adenopathy. Neurologic: Grossly intact, no focal deficits, moving  all 4 extremities. Psychiatric: Normal mood and affect.  Laboratory Data: Lab Results  Component Value Date   WBC 8.6 11/15/2011   HGB 14.5 11/15/2011   HCT 41.1 11/15/2011   MCV 90 11/15/2011   PLT 192 11/15/2011    Lab Results  Component Value Date   CREATININE 0.75* 01/20/2015    No results found for: PSA  No results found for: TESTOSTERONE  No results found for: HGBA1C  Urinalysis    Component Value Date/Time   COLORURINE YELLOW* 12/26/2014 1116   APPEARANCEUR CLEAR* 12/26/2014 1116   LABSPEC 1.021 12/26/2014 1116   PHURINE 7.0 12/26/2014 1116   GLUCOSEU NEGATIVE 12/26/2014 1116   HGBUR 1+* 12/26/2014 1116   BILIRUBINUR NEGATIVE 12/26/2014 1116   KETONESUR NEGATIVE 12/26/2014 1116   PROTEINUR NEGATIVE 12/26/2014 1116   NITRITE NEGATIVE 12/26/2014 1116   LEUKOCYTESUR NEGATIVE 12/26/2014 1116    Pertinent Imaging: CT Urogram pending   Cystoscopy Procedure  Note  Patient identification was confirmed, informed consent was obtained, and patient was prepped using Betadine solution.  Lidocaine jelly was administered per urethral meatus.    Preoperative abx where received prior to procedure.     Pre-Procedure: - Inspection reveals a normal caliber ureteral meatus.  Procedure: The flexible cystoscope was introduced without difficulty - No urethral strictures/lesions are present. - Enlarged prostate  - Normal bladder neck - Bilateral ureteral orifices identified - Bladder mucosa  reveals no ulcers, tumors, or lesions - No bladder stones - No trabeculation  Retroflexion shows no intravesical lobe   Post-Procedure: - Patient tolerated the procedure well  Assessment & Plan:    1. Microscopic hematuria The patient has had a negative cystoscopy. He still needs a CT urogram. We will schedule a follow-up appointment for him as the patient has some short-term memory loss to ensure that its proper follow-up.   Return in about 3 weeks (around 03/03/2015) for after CT Urogram.  Hildred Laser, MD  [facility] [address], [address] [place], [place] [address] [phone]

## 2015-02-15 ENCOUNTER — Other Ambulatory Visit: Payer: Self-pay

## 2015-02-15 DIAGNOSIS — R3129 Other microscopic hematuria: Secondary | ICD-10-CM

## 2015-02-28 ENCOUNTER — Encounter: Payer: Self-pay | Admitting: Emergency Medicine

## 2015-02-28 ENCOUNTER — Inpatient Hospital Stay
Admission: EM | Admit: 2015-02-28 | Discharge: 2015-03-02 | DRG: 101 | Disposition: A | Discharge status: Home / Self Care | Payer: 59 | Attending: Internal Medicine | Admitting: Internal Medicine

## 2015-02-28 DIAGNOSIS — F05 Delirium due to known physiological condition: Secondary | ICD-10-CM

## 2015-02-28 DIAGNOSIS — R32 Unspecified urinary incontinence: Secondary | ICD-10-CM | POA: Diagnosis present

## 2015-02-28 DIAGNOSIS — G40309 Generalized idiopathic epilepsy and epileptic syndromes, not intractable, without status epilepticus: Principal | ICD-10-CM | POA: Diagnosis present

## 2015-02-28 DIAGNOSIS — I10 Essential (primary) hypertension: Secondary | ICD-10-CM | POA: Diagnosis present

## 2015-02-28 DIAGNOSIS — F0390 Unspecified dementia without behavioral disturbance: Secondary | ICD-10-CM | POA: Diagnosis present

## 2015-02-28 DIAGNOSIS — E860 Dehydration: Secondary | ICD-10-CM | POA: Diagnosis present

## 2015-02-28 DIAGNOSIS — R4701 Aphasia: Secondary | ICD-10-CM | POA: Diagnosis present

## 2015-02-28 DIAGNOSIS — D72829 Elevated white blood cell count, unspecified: Secondary | ICD-10-CM | POA: Diagnosis present

## 2015-02-28 DIAGNOSIS — R569 Unspecified convulsions: Secondary | ICD-10-CM

## 2015-02-28 DIAGNOSIS — Z87891 Personal history of nicotine dependence: Secondary | ICD-10-CM

## 2015-02-28 DIAGNOSIS — R112 Nausea with vomiting, unspecified: Secondary | ICD-10-CM

## 2015-02-28 DIAGNOSIS — Z9114 Patient's other noncompliance with medication regimen: Secondary | ICD-10-CM

## 2015-02-28 DIAGNOSIS — Z79899 Other long term (current) drug therapy: Secondary | ICD-10-CM | POA: Diagnosis not present

## 2015-02-28 DIAGNOSIS — E871 Hypo-osmolality and hyponatremia: Secondary | ICD-10-CM | POA: Diagnosis present

## 2015-02-28 DIAGNOSIS — Z8673 Personal history of transient ischemic attack (TIA), and cerebral infarction without residual deficits: Secondary | ICD-10-CM

## 2015-02-28 HISTORY — DX: Nausea with vomiting, unspecified: R11.2

## 2015-02-28 LAB — COMPREHENSIVE METABOLIC PANEL
ALT: 21 U/L (ref 17–63)
AST: 38 U/L (ref 15–41)
Albumin: 4.7 g/dL (ref 3.5–5.0)
Alkaline Phosphatase: 76 U/L (ref 38–126)
Anion gap: 12 (ref 5–15)
BUN: 17 mg/dL (ref 6–20)
CHLORIDE: 97 mmol/L — AB (ref 101–111)
CO2: 23 mmol/L (ref 22–32)
CREATININE: 1.03 mg/dL (ref 0.61–1.24)
Calcium: 9.2 mg/dL (ref 8.9–10.3)
GLUCOSE: 195 mg/dL — AB (ref 65–99)
POTASSIUM: 3.7 mmol/L (ref 3.5–5.1)
Sodium: 132 mmol/L — ABNORMAL LOW (ref 135–145)
Total Bilirubin: 0.5 mg/dL (ref 0.3–1.2)
Total Protein: 7.8 g/dL (ref 6.5–8.1)

## 2015-02-28 LAB — CBC
HCT: 43.9 % (ref 40.0–52.0)
Hemoglobin: 15 g/dL (ref 13.0–18.0)
MCH: 30.7 pg (ref 26.0–34.0)
MCHC: 34.1 g/dL (ref 32.0–36.0)
MCV: 90.1 fL (ref 80.0–100.0)
PLATELETS: 209 10*3/uL (ref 150–440)
RBC: 4.87 MIL/uL (ref 4.40–5.90)
RDW: 13.3 % (ref 11.5–14.5)
WBC: 21.9 10*3/uL — ABNORMAL HIGH (ref 3.8–10.6)

## 2015-02-28 LAB — PHENYTOIN LEVEL, TOTAL: PHENYTOIN LVL: 16.3 ug/mL (ref 10.0–20.0)

## 2015-02-28 LAB — ETHANOL: Alcohol, Ethyl (B): <5 mg/dL (ref <5)

## 2015-02-28 MED ORDER — ONDANSETRON HCL 4 MG/2ML IJ SOLN
4.0000 mg | Freq: Once | INTRAMUSCULAR | Status: AC
  Administered 2015-02-28: 4 mg via INTRAVENOUS

## 2015-02-28 MED ORDER — PANTOPRAZOLE SODIUM 40 MG IV SOLR
40.0000 mg | INTRAVENOUS | Status: DC
  Administered 2015-02-28 – 2015-03-01 (×2): 40 mg via INTRAVENOUS
  Filled 2015-02-28 (×2): qty 40

## 2015-02-28 MED ORDER — SODIUM CHLORIDE 0.9 % IV BOLUS (SEPSIS)
1000.0000 mL | Freq: Once | INTRAVENOUS | Status: AC
  Administered 2015-02-28: 1000 mL via INTRAVENOUS

## 2015-02-28 MED ORDER — ONDANSETRON HCL 4 MG/2ML IJ SOLN
INTRAMUSCULAR | Status: AC
  Administered 2015-02-28: 4 mg via INTRAVENOUS
  Filled 2015-02-28: qty 2

## 2015-02-28 MED ORDER — METOPROLOL TARTRATE 25 MG PO TABS: 25.0000 mg | ORAL_TABLET | Freq: Two times a day (BID) | ORAL | Status: DC

## 2015-02-28 MED ORDER — SODIUM CHLORIDE 0.9 % IV SOLN
INTRAVENOUS | Status: DC
  Administered 2015-03-01: 1000 mL via INTRAVENOUS
  Administered 2015-03-01: 01:00:00 via INTRAVENOUS

## 2015-02-28 MED ORDER — SODIUM CHLORIDE 0.9 % IV SOLN
500.0000 mg | Freq: Two times a day (BID) | INTRAVENOUS | Status: DC
  Administered 2015-03-01: 500 mg via INTRAVENOUS
  Filled 2015-02-28 (×3): qty 5

## 2015-02-28 MED ORDER — PHENYTOIN SODIUM 50 MG/ML IJ SOLN
100.0000 mg | Freq: Three times a day (TID) | INTRAMUSCULAR | Status: DC
  Administered 2015-03-01 (×2): 100 mg via INTRAVENOUS
  Filled 2015-02-28 (×5): qty 2

## 2015-02-28 MED ORDER — LORAZEPAM 2 MG/ML IJ SOLN: 2.0000 mg | INTRAMUSCULAR | Status: DC | PRN

## 2015-02-28 MED ORDER — PHENYTOIN SODIUM EXTENDED 100 MG PO CAPS: 100.0000 mg | ORAL_CAPSULE | Freq: Three times a day (TID) | ORAL | Status: DC

## 2015-02-28 MED ORDER — LEVETIRACETAM 750 MG PO TABS: 750.0000 mg | ORAL_TABLET | Freq: Two times a day (BID) | ORAL | Status: DC

## 2015-02-28 NOTE — ED Notes (Signed)
Pt arrived to the ED via EMS after having a seizure about 40 min ago. EMS reports that the Pt is a seizure Pt and missed a dose of his dilantin and when that happens the Pt usually zeiss. Per Pt's wife, the last zeisure was in May and it takes the Pt 2hr to recover after a seizure. Pt is sleeping upon arrival, placed in [place] 2LPM due to low O2 sats; MD at bedside on arrival.

## 2015-02-28 NOTE — ED Notes (Signed)
Pt bedding and goun were changed after the Pt voided on the bed.

## 2015-02-28 NOTE — ED Notes (Signed)
Pt vomited, Dr. Sharma CovertNorman notified. Antiemetic was ordered.

## 2015-02-28 NOTE — H&P (Signed)
[facility] [place] [facility]   PATIENT NAME: Nicholas Rowe    MR#:  [id number]  DATE OF BIRTH:  04/03/1945  DATE OF ADMISSION:  02/28/2015  PRIMARY CARE PHYSICIAN: Lonell FaceSHAH, HEMANG K, MD   REQUESTING/REFERRING PHYSICIAN:   CHIEF COMPLAINT:   Chief Complaint  Patient presents with  . Seizures    HISTORY OF PRESENT ILLNESS: Nicholas Rowe  is a 69 y.o. male with a known history of seizure disorder, ulcerative colitis presented to the emergency room after he had a seizure. Patient had a seizure at home was witnessed by his wife. It lasted for 20 minutes and then emergency medical services were called blood sugar in the field was 244 mg/dL. Patient missed his dose of Dilantin medication according to the history given by family member. Patient has has these breakthrough seizures yearly whenever he misses his Dilantin medication. No history of any head injury. Patient is in postictal state in the emergency room and unable to give any history. He is confused and when any questions as he laughs. Does not give much information. Patient also had nausea. Had an episode of vomiting in the emergency room. Vomitus contained food and water particles.  PAST MEDICAL HISTORY:   Past Medical History  Diagnosis Date  . Seizures (HCC)   . Colitis   . H/O chronic ulcerative colitis 10/01/2014  . 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 No pertinent past surgical history.  SOCIAL HISTORY:  Social History  Substance Use Topics  . Smoking status: Former Smoker    Quit date: 04/02/1985  . Smokeless tobacco: Not on file  . Alcohol Use: No    FAMILY HISTORY:  Family History  Problem Relation Age of Onset  . Hematuria Neg Hx   . Kidney cancer Neg Hx   . Bladder Cancer Neg Hx     DRUG ALLERGIES: No Known Allergies  REVIEW OF SYSTEMS:   Unable to obtain as patient is in post ictal state and confused.Marland Kitchen.   MEDICATIONS  AT HOME:  Prior to Admission medications   Medication Sig Start Date End Date Taking? Authorizing Provider  donepezil (ARICEPT) 10 MG tablet Take by mouth. 11/25/14 02/23/15  Historical Provider, MD  Levetiracetam (KEPPRA XR) 750 MG TB24 Take by mouth. 12/25/14 03/25/15  Historical Provider, MD  metoprolol tartrate (LOPRESSOR) 25 MG tablet Take by mouth. 12/01/14   Historical Provider, MD  Multiple Vitamin (MULTI-VITAMINS) TABS Take by mouth.    Historical Provider, MD  oxyCODONE-acetaminophen (PERCOCET) 5-325 MG per tablet Take 1 tablet by mouth every 4 (four) hours as needed for severe pain. Patient not taking: Reported on 01/20/2015 12/26/14   Tommi Rumpshonda L Summers, PA-C  phenytoin (DILANTIN) 100 MG ER capsule Take by mouth. 12/25/14 03/25/15  Historical Provider, MD  Vitamin D, Ergocalciferol, (DRISDOL) 50000 UNITS CAPS capsule Take by mouth.    Historical Provider, MD      PHYSICAL EXAMINATION:   VITAL SIGNS: Blood pressure 141/79, pulse 92, temperature 98.8 F (37.1 C), temperature source Oral, resp. rate 20, weight 76.386 kg (168 lb 6.4 oz), SpO2 94 %.  GENERAL:  69 y.o.-year-old patient lying in the bed with no acute distress.  EYES: Pupils equal, round, reactive to light and accommodation. No scleral icterus. Extraocular muscles intact.  HEENT: Head atraumatic, normocephalic. Oropharynx and nasopharynx dry.  NECK:  Supple, no jugular venous distention. No thyroid enlargement, no tenderness.  LUNGS: Normal breath sounds bilaterally, no wheezing, rales,rhonchi or  crepitation. No use of accessory muscles of respiration.  CARDIOVASCULAR: S1, S2 normal. No murmurs, rubs, or gallops.  ABDOMEN: Soft, nontender, nondistended. Bowel sounds present. No organomegaly or mass.  EXTREMITIES: No pedal edema, cyanosis, or clubbing.  NEUROLOGIC: Cranial nerves II through XII are intact. Muscle strength 5/5 in all extremities. Sensation intact. Gait not checked.  PSYCHIATRIC: The patient is not alert and  not oriented to time,place and person.  SKIN: No obvious rash, lesion, or ulcer.   LABORATORY PANEL:   CBC  Recent Labs Lab 02/28/15 2028  WBC 21.9*  HGB 15.0  HCT 43.9  PLT 209  MCV 90.1  MCH 30.7  MCHC 34.1  RDW 13.3   ------------------------------------------------------------------------------------------------------------------  Chemistries   Recent Labs Lab 02/28/15 2028  NA 132*  K 3.7  CL 97*  CO2 23  GLUCOSE 195*  BUN 17  CREATININE 1.03  CALCIUM 9.2  AST 38  ALT 21  ALKPHOS 76  BILITOT 0.5   ------------------------------------------------------------------------------------------------------------------ estimated creatinine clearance is 62.8 mL/min (by C-G formula based on Cr of 1.03). ------------------------------------------------------------------------------------------------------------------ No results for input(s): TSH, T4TOTAL, T3FREE, THYROIDAB in the last 72 hours.  Invalid input(s): FREET3   Coagulation profile No results for input(s): INR, PROTIME in the last 168 hours. ------------------------------------------------------------------------------------------------------------------- No results for input(s): DDIMER in the last 72 hours. -------------------------------------------------------------------------------------------------------------------  Cardiac Enzymes No results for input(s): CKMB, TROPONINI, MYOGLOBIN in the last 168 hours.  Invalid input(s): CK ------------------------------------------------------------------------------------------------------------------ Invalid input(s): POCBNP  ---------------------------------------------------------------------------------------------------------------  Urinalysis    Component Value Date/Time   COLORURINE YELLOW* 12/26/2014 1116   APPEARANCEUR CLEAR* 12/26/2014 1116   LABSPEC 1.021 12/26/2014 1116   PHURINE 7.0 12/26/2014 1116   GLUCOSEU Negative 02/10/2015 1335    HGBUR 1+* 12/26/2014 1116   BILIRUBINUR Negative 02/10/2015 1335   BILIRUBINUR NEGATIVE 12/26/2014 1116   KETONESUR NEGATIVE 12/26/2014 1116   PROTEINUR NEGATIVE 12/26/2014 1116   NITRITE Negative 02/10/2015 1335   NITRITE NEGATIVE 12/26/2014 1116   LEUKOCYTESUR Negative 02/10/2015 1335   LEUKOCYTESUR NEGATIVE 12/26/2014 1116     RADIOLOGY: No results found.  EKG: Orders placed or performed during the hospital encounter of 02/28/15  . EKG 12-Lead  . EKG 12-Lead    IMPRESSION AND PLAN: 1. Breakthrough seizure 2. Hyponatremia 3. Leukocytosis 4. Dehydration 5. History of seizure disorder 6.Altered mental status could be from post ictal state. Management plan 1. Patient unable to tolerate oral medication, will start patient on IV Keppra 500 mg every 12 hourly and IV Dilantin 100 mg every 8 hourly 2. IV hydration with normal saline 3. Follow-up sodium level and WBC count 4. Leukocytosis could be secondary to vomiting and dehydration 5. Monitor patient on telemetry 6. IV ativan when necessary for seizure  All the records are reviewed and case discussed with ED provider. Management plans discussed with the patient, family and they are in agreement.  CODE STATUS: FULL Advance Directive Documentation        Most Recent Value   Type of Advance Directive  Living will   Pre-existing out of facility DNR order (yellow form or pink MOST form)     "MOST" Form in Place?         TOTAL TIME TAKING CARE OF THIS PATIENT: 49 minutes.    Ihor Austin M.D on 02/28/2015 at 11:38 PM  Between 7am to 6pm - Pager - [phone]  After 6pm go to [email or web address] - password [email or web address] [facility] [place]  [place] [facility]  Office  [phone]  CC: Primary care physician; [facility], Durene Cal,  MD     

## 2015-02-28 NOTE — ED Provider Notes (Signed)
[facility] Emergency Department Provider Note  ____________________________________________  Time seen: Approximately 8:13 PM  I have reviewed the triage vital signs and the nursing notes.   HISTORY  Chief Complaint Seizures    HPI Nicholas Rowe is a 69 y.o. male a history of seizure disorder on Keppra and Dilantin presenting with seizure after missing Dilantin. The patient is unable to give any history due to his postictal state. Per EMS, the patient's wife reports that he has recently missed a dose of Dilantin. He has several breakthrough seizures yearly, usually related to missed medication. Per report, the patient was having intermittent seizures for approximately 20 minutes prior to EMS being called. Positive urinary incontinence. He had stopped seizing on their arrival, had normal vital signs with mild tachycardia in the low 100s and O2 sats in the low 90s, blood sugar was 244. No known recent history of trauma, illness, or medication changes.   Past Medical History  Diagnosis Date  . Seizures (HCC)   . Colitis   . H/O chronic ulcerative colitis 10/01/2014  . 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 H/O chronic ulcerative colitis 10/01/2014  . 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 No pertinent past surgical history.  Current Outpatient Rx  Name  Route  Sig  Dispense  Refill  . EXPIRED: donepezil (ARICEPT) 10 MG tablet   Oral   Take by mouth.         . Levetiracetam (KEPPRA XR) 750 MG TB24   Oral   Take by mouth.         . metoprolol tartrate (LOPRESSOR) 25 MG tablet   Oral   Take by mouth.         . Multiple Vitamin (MULTI-VITAMINS) TABS   Oral   Take by mouth.         . oxyCODONE-acetaminophen (PERCOCET) 5-325 MG per tablet   Oral   Take 1 tablet by mouth every 4 (four) hours as needed for severe pain. Patient not taking:  Reported on 01/20/2015   20 tablet   0   . phenytoin (DILANTIN) 100 MG ER capsule   Oral   Take by mouth.         . Vitamin D, Ergocalciferol, (DRISDOL) 50000 UNITS CAPS capsule   Oral   Take by mouth.           Allergies Review of patient's allergies indicates no known allergies.  Family History  Problem Relation Age of Onset  . Hematuria Neg Hx   . Kidney cancer Neg Hx   . Bladder Cancer Neg Hx     Social History Social History  Substance Use Topics  . Smoking status: Former Smoker    Quit date: 04/02/1985  . Smokeless tobacco: None  . Alcohol Use: No    Review of Systems Unable to obtain due to patient postictal state. 10-point ROS otherwise negative.  ____________________________________________   PHYSICAL EXAM:  VITAL SIGNS: ED Triage Vitals  Enc Vitals Group     BP --      Pulse --      Resp --      Temp --      Temp src --      SpO2 --      Weight --      Height --      Head Cir --      Peak Flow --  Pain Score --      Pain Loc --      Pain Edu? --      Excl. in GC? --     Constitutional: Patient is post ictal but responsive to noxious stimulus such as sternal rub. He does not give any verbal response but does open eyes. Eyes: Conjunctivae are normal.  EOMI. PERRLA. Head: Atraumatic. No raccoon eyes or Battle sign. No malocclusion or obvious dental injury. Nose: No congestion/rhinnorhea. Mouth/Throat: Mucous membranes are dry.  Neck: No stridor.  Supple.  No step-offs or deformities in the C-spine. Cardiovascular: Fast rate in the low 100s, regular rhythm. No murmurs, rubs or gallops.  Respiratory: Normal respiratory effort.  No retractions. Lungs CTAB.  No wheezes, rales or ronchi. Gastrointestinal: Soft and nontender. No distention. No peritoneal signs. Genitourinary: Positive urinary incontinence Musculoskeletal: No LE edema.  Neurologic:  Patient is postictal. Does respond to sternal rub. Moves all extremities with noxious  stimulus. Normal gag reflex Skin:  Skin is warm, dry and intact. No rash noted. psychiatric: Unable to assess ____________________________________________   LABS (all labs ordered are listed, but only abnormal results are displayed)  Labs Reviewed  CBC - Abnormal; Notable for the following:    WBC 21.9 (*)    All other components within normal limits  COMPREHENSIVE METABOLIC PANEL - Abnormal; Notable for the following:    Sodium 132 (*)    Chloride 97 (*)    Glucose, Bld 195 (*)    All other components within normal limits  ETHANOL  PHENYTOIN LEVEL, TOTAL  URINE DRUG SCREEN, QUALITATIVE ([facility] ONLY)   ____________________________________________  EKG  ED ECG REPORT I, Rockne Menghini, the attending physician, personally viewed and interpreted this ECG.   Date: 02/28/2015  EKG Time: 2019  Rate: 100  Rhythm: sinus tachycardia  Axis: Leftward  Intervals:none  ST&T Change: Nonspecific T-wave inversion in V1. No ST elevation.  ____________________________________________  RADIOLOGY  No results found.  ____________________________________________   PROCEDURES  Procedure(s) performed: None  Critical Care performed: No ____________________________________________   INITIAL IMPRESSION / ASSESSMENT AND PLAN / ED COURSE  Pertinent labs & imaging results that were available during my care of the patient were reviewed by me and considered in my medical decision making (see chart for details).  69 y.o. with a history of seizure disorder presenting with 20 minute self resolving seizures after missing medication. At this time, the patient's clinical presentation is most consistent with a postictal state. I do not see any evidence of trauma that would lead me to suspect a head bleed. Will plan basic labs and Dilantin level, IV fluids given his dry mucous membranes and reevaluation.  ----------------------------------------- 9:50 PM on  02/28/2015 -----------------------------------------  Patient continues to be hemodynamically stable. He is waking up slowly and is now spontaneously opening his eyes and smiling but still nonverbal. His family is at bedside and states that this is typical after he has seizures. His labs show a therapeutic Dilantin level. He has not elevated white blood cell count which is likely due to the prolonged seizure that he had. I'll continue to monitor him for return to normal mental status.  ----------------------------------------- 10:56 PM on 02/28/2015 -----------------------------------------  Called to bedside because the patient still nonverbal but had a large bout of emesis. At this point, I'll plan to admit the patient for further observation until he regained his normal mental status.  ____________________________________________  FINAL CLINICAL IMPRESSION(S) / ED DIAGNOSES  Final diagnoses:  Seizure (HCC)  Hyponatremia  Nausea and vomiting, vomiting of unspecified type      NEW MEDICATIONS STARTED DURING THIS VISIT:  New Prescriptions   No medications on file     Rockne MenghiniAnne-Caroline Kento Gossman, MD 02/28/15 2257

## 2015-02-28 NOTE — ED Notes (Signed)
Pt's wife is at bedside and states that it takes the Pt at least 8hrs to recover from a seizure. MD notified.

## 2015-03-01 ENCOUNTER — Inpatient Hospital Stay: Payer: 59

## 2015-03-01 LAB — BASIC METABOLIC PANEL
Anion gap: 7 (ref 5–15)
BUN: 11 mg/dL (ref 6–20)
CALCIUM: 8.1 mg/dL — AB (ref 8.9–10.3)
CO2: 21 mmol/L — AB (ref 22–32)
CREATININE: 0.69 mg/dL (ref 0.61–1.24)
Chloride: 105 mmol/L (ref 101–111)
GFR calc non Af Amer: >60 mL/min (ref >60)
Glucose, Bld: 128 mg/dL — ABNORMAL HIGH (ref 65–99)
Potassium: 3.8 mmol/L (ref 3.5–5.1)
SODIUM: 133 mmol/L — AB (ref 135–145)

## 2015-03-01 LAB — CBC
HCT: 38.9 % — ABNORMAL LOW (ref 40.0–52.0)
Hemoglobin: 13.2 g/dL (ref 13.0–18.0)
MCH: 30.7 pg (ref 26.0–34.0)
MCHC: 33.9 g/dL (ref 32.0–36.0)
MCV: 90.6 fL (ref 80.0–100.0)
Platelets: 176 10*3/uL (ref 150–440)
RBC: 4.3 MIL/uL — ABNORMAL LOW (ref 4.40–5.90)
RDW: 13.1 % (ref 11.5–14.5)
WBC: 13.7 10*3/uL — ABNORMAL HIGH (ref 3.8–10.6)

## 2015-03-01 LAB — URINE DRUG SCREEN, QUALITATIVE (ARMC ONLY)
AMPHETAMINES, UR SCREEN: NOT DETECTED
BARBITURATES, UR SCREEN: NOT DETECTED
BENZODIAZEPINE, UR SCRN: NOT DETECTED
COCAINE METABOLITE, UR ~~LOC~~: NOT DETECTED
Cannabinoid 50 Ng, Ur ~~LOC~~: POSITIVE — AB
MDMA (Ecstasy)Ur Screen: NOT DETECTED
METHADONE SCREEN, URINE: NOT DETECTED
OPIATE, UR SCREEN: NOT DETECTED
PHENCYCLIDINE (PCP) UR S: NOT DETECTED
Tricyclic, Ur Screen: NOT DETECTED

## 2015-03-01 LAB — PHENYTOIN LEVEL, TOTAL: PHENYTOIN LVL: 17 ug/mL (ref 10.0–20.0)

## 2015-03-01 MED ORDER — PHENYTOIN SODIUM EXTENDED 100 MG PO CAPS
300.0000 mg | ORAL_CAPSULE | Freq: Every day | ORAL | Status: DC
  Administered 2015-03-01: 300 mg via ORAL
  Filled 2015-03-01 (×2): qty 3

## 2015-03-01 MED ORDER — DONEPEZIL HCL 5 MG PO TABS
10.0000 mg | ORAL_TABLET | Freq: Every day | ORAL | Status: DC
  Administered 2015-03-01: 10 mg via ORAL
  Filled 2015-03-01: qty 2

## 2015-03-01 MED ORDER — SODIUM CHLORIDE 0.9 % IJ SOLN
3.0000 mL | Freq: Two times a day (BID) | INTRAMUSCULAR | Status: DC
  Administered 2015-03-01 (×2): 3 mL via INTRAVENOUS

## 2015-03-01 MED ORDER — LORAZEPAM 2 MG/ML IJ SOLN: 2.0000 mg | INTRAMUSCULAR | Status: DC | PRN

## 2015-03-01 MED ORDER — ONDANSETRON HCL 4 MG PO TABS: 4.0000 mg | ORAL_TABLET | Freq: Four times a day (QID) | ORAL | Status: DC | PRN

## 2015-03-01 MED ORDER — LEVETIRACETAM 500 MG PO TABS
1500.0000 mg | ORAL_TABLET | Freq: Two times a day (BID) | ORAL | Status: DC
  Administered 2015-03-01 – 2015-03-02 (×2): 1500 mg via ORAL
  Filled 2015-03-01 (×2): qty 3

## 2015-03-01 MED ORDER — ONDANSETRON HCL 4 MG/2ML IJ SOLN
4.0000 mg | Freq: Four times a day (QID) | INTRAMUSCULAR | Status: DC | PRN
  Administered 2015-03-01: 4 mg via INTRAVENOUS
  Filled 2015-03-01: qty 2

## 2015-03-01 MED ORDER — ENOXAPARIN SODIUM 40 MG/0.4ML ~~LOC~~ SOLN: 40.0000 mg | SUBCUTANEOUS | Status: DC

## 2015-03-01 MED ORDER — LEVETIRACETAM ER 500 MG PO TB24
1500.0000 mg | ORAL_TABLET | Freq: Two times a day (BID) | ORAL | Status: DC
  Filled 2015-03-01: qty 3

## 2015-03-01 NOTE — Progress Notes (Signed)
Spoke with Soyla Murphy. Konidena about patient requesting to have diet upgraded. After reviewing patients history and notes, Dr. Luberta MutterKonidena stated that it would be ok to upgrade patient to a full liquid diet.

## 2015-03-01 NOTE — Progress Notes (Signed)
[facility] - [place] at [facility]   PATIENT NAME: Nicholas Rowe    MR#:  [id number]  DATE OF BIRTH:  11/26/1945  SUBJECTIVE: seen at bedside. Admitted for seizure.still confused.  CHIEF COMPLAINT:   Chief Complaint  Patient presents with  . Seizures    REVIEW OF SYSTEMS:   ROS CONSTITUTIONAL: No fever, fatigue or weakness.  EYES: No blurred or double vision.  EARS, NOSE, AND THROAT: No tinnitus or ear pain.  RESPIRATORY: No cough, shortness of breath, wheezing or hemoptysis.  CARDIOVASCULAR: No chest pain, orthopnea, edema.  GASTROINTESTINAL: No nausea, vomiting, diarrhea or abdominal pain.  GENITOURINARY: No dysuria, hematuria.  ENDOCRINE: No polyuria, nocturia,  HEMATOLOGY: No anemia, easy bruising or bleeding SKIN: No rash or lesion. MUSCULOSKELETAL: No joint pain or arthritis.   NEUROLOGIC: No tingling, numbness, weakness.  PSYCHIATRY: No anxiety or depression.   DRUG ALLERGIES:  No Known Allergies  VITALS:  Blood pressure 124/62, pulse 91, temperature 97.7 F (36.5 C), temperature source Oral, resp. rate 22, height  (1.676 m), weight 75.388 kg (166 lb 3.2 oz), SpO2 99 %.  PHYSICAL EXAMINATION:  GENERAL:  69 y.o.-year-old patient lying in the bed with no acute distress.  EYES: Pupils equal, round, reactive to light and accommodation. No scleral icterus. Extraocular muscles intact.  HEENT: Head atraumatic, normocephalic. Oropharynx and nasopharynx clear.  NECK:  Supple, no jugular venous distention. No thyroid enlargement, no tenderness.  LUNGS: Normal breath sounds bilaterally, no wheezing, rales,rhonchi or crepitation. No use of accessory muscles of respiration.  CARDIOVASCULAR: S1, S2 normal. No murmurs, rubs, or gallops.  ABDOMEN: Soft, nontender, nondistended. Bowel sounds present. No organomegaly or mass.  EXTREMITIES: No pedal edema, cyanosis, or clubbing.  NEUROLOGIC: Cranial nerves II through XII are intact. Muscle strength  5/5 in all extremities. Sensation intact. Gait not checked.  PSYCHIATRIC: The patient is alert and oriented x 3.  SKIN: No obvious rash, lesion, or ulcer.    LABORATORY PANEL:   CBC  Recent Labs Lab 03/01/15 0711  WBC 13.7*  HGB 13.2  HCT 38.9*  PLT 176   ------------------------------------------------------------------------------------------------------------------  Chemistries   Recent Labs Lab 02/28/15 2028 03/01/15 0711  NA 132* 133*  K 3.7 3.8  CL 97* 105  CO2 23 21*  GLUCOSE 195* 128*  BUN 17 11  CREATININE 1.03 0.69  CALCIUM 9.2 8.1*  AST 38  --   ALT 21  --   ALKPHOS 76  --   BILITOT 0.5  --    ------------------------------------------------------------------------------------------------------------------  Cardiac Enzymes No results for input(s): TROPONINI in the last 168 hours. ------------------------------------------------------------------------------------------------------------------  RADIOLOGY:  No results found.  EKG:   Orders placed or performed during the hospital encounter of 02/28/15  . EKG 12-Lead  . EKG 12-Lead    ASSESSMENT AND PLAN:   #1 recurrent seizures secondary to missed Dilantin; patient is awake and alert, but having difficulty getting the words out ,has expressive aphasia,: Head CT stat, continue Keppra, Dilantin, IV hydration,  H/o ulcerative colitis. Not on medicines at home. #3 hypertension continue metoprolol.; T   possible dementia supposed to be on Aricept 10 mg daily  All the records are reviewed and case discussed with Care Management/Social Workerr. Management plans discussed with the patient, family and they are in agreement.  CODE STATUS: full  TOTAL TIME TAKING CARE OF THIS PATIENT:35 minutes.   POSSIBLE D/C IN  1-2 DAYS, DEPENDING ON CLINICAL CONDITION.   Katha Hamming M.D on 03/01/2015 at 12:24 PM  Between  7am to 6pm - Pager - [phone]  After 6pm go to [email or web address] - password  [facility]  [facility]  Office  [phone]  CC: Primary care physician; [facility], Durene CalHEMANG K, MD   Note: This dictation was prepared with Dragon dictation along with smaller phrase technology. Any transcriptional errors that result from this process are unintentional.

## 2015-03-01 NOTE — Progress Notes (Signed)
Dr. Tobi BastosPyreddy notified of pt pulling off tele monitor and pulling off clothes and attempting to get OOB.  Asked to change ativan to PRN for seizures and agitation and to d/c tele since pt will not keep monitor on. MD said OK.

## 2015-03-01 NOTE — Progress Notes (Signed)
Patient arrived on unit  shortly before 0100. Patient was asleep but easy to arouse. Patient would only respond to stimuli. Was unable to answer any admission questions upon arrival.  Will continue to monitor for changes.

## 2015-03-02 MED ORDER — PHENYTOIN SODIUM EXTENDED 300 MG PO CAPS: 300.0000 mg | ORAL_CAPSULE | Freq: Every day | ORAL | Status: DC

## 2015-03-02 MED ORDER — LEVETIRACETAM 750 MG PO TABS: 1500.0000 mg | ORAL_TABLET | Freq: Two times a day (BID) | ORAL | Status: DC

## 2015-03-02 NOTE — Discharge Summary (Signed)
Nicholas Rowe, is a 69 y.o. male  DOB 22-Jul-1945  MRN [id number].  Admission date:  02/28/2015  Admitting Physician  Ihor Austin, MD  Discharge Date:  03/02/2015   Primary MD  [facility], Durene Cal, MD  Recommendations for primary care physician for things to follow:   Follow-up with Dr. Cristopher Peru as scheduled.   Admission Diagnosis  Hyponatremia [E87.1] Seizure (HCC) [R56.9] Nausea and vomiting, vomiting of unspecified type [R11.2]   Discharge Diagnosis  Hyponatremia [E87.1] Seizure (HCC) [R56.9] Nausea and vomiting, vomiting of unspecified type [R11.2]    Principal Problem:   Seizure (HCC) Active Problems:   Nausea & vomiting      Past Medical History  Diagnosis Date  . Seizures (HCC)   . Colitis   . H/O chronic ulcerative colitis 10/01/2014  . 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 No pertinent past surgical history.     History of present illness and  Hospital Course:     Kindly see H&P for history of present illness and admission details, please review complete Labs, Consult reports and Test reports for all details in brief  HPI  from the history and physical done on the day of admission 69 year old male with history of systems Gen., ulcerative colitis comes in because of seizure. Witnessed by wife,missed of Dilantin at home. Admitted for seizures.   Hospital Course  #1 seizure secondary to noncompliance with medications: Patient received IV Keppra 500 mg every 12, Dilantin 100 mg IV every 8 hours. Postictal phase resolved. Head CT did not show any acute changes. Discharge home with keppra  1500 mg twice a day, Dilantin 300 mg daily. Advised the patient to been compliant with medications for seizures.  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 No deficit. #3  .Mild dementia continue Aricept. 4. Hypertension continue home medications metoprolol. For hyponatremia improved with hydration. #6 leukocytosis  Due to  stress improved.  Discharge Condition: stable   Follow UP      Discharge Instructions  and  Discharge Medications        Medication List    STOP taking these medications        KEPPRA XR 750 MG Tb24  Generic drug:  Levetiracetam  Replaced by:  levETIRAcetam 750 MG tablet      TAKE these medications        donepezil 10 MG tablet  Commonly known as:  ARICEPT  Take by mouth.     levETIRAcetam 750 MG tablet  Commonly known as:  KEPPRA  Take 2 tablets (1,500 mg total) by mouth 2 (two) times daily.     metoprolol tartrate 25 MG tablet  Commonly known as:  LOPRESSOR  Take by mouth.     MULTI-VITAMINS Tabs  Take by mouth.     oxyCODONE-acetaminophen 5-325 MG tablet  Commonly known as:  PERCOCET  Take 1 tablet by mouth every 4 (four) hours as needed for severe pain.     phenytoin 300 MG ER capsule  Commonly known as:  DILANTIN  Take 1 capsule (300 mg total) by mouth at bedtime.     Vitamin D (Ergocalciferol) 50000 UNITS Caps capsule  Commonly known as:  DRISDOL  Take by mouth.          Diet and Activity recommendation: See Discharge Instructions above   Consults obtained -none   Major procedures and Radiology Reports - PLEASE review detailed and final reports for all details, in brief -  Ct Head Wo Contrast  03/01/2015  CLINICAL DATA:  Seizure.  Hyperglycemia EXAM: CT HEAD WITHOUT CONTRAST TECHNIQUE: Contiguous axial images were obtained from the base of the skull through the vertex without intravenous contrast. COMPARISON:  June 02, 2014 head CT and brain MRI November 15, 2011 FINDINGS: The ventricles are normal in size and configuration. There is no intracranial mass, hemorrhage, extra-axial fluid collection, or midline shift. There are prior infarcts in the left parietal and occipital lobes,  unchanged from prior studies. There is decreased attenuation in the lateral right upper pons, unchanged from prior CT examination. No new gray-white compartment lesions are identified. No acute infarct evident. Bony calvarium appears intact. The mastoid air cells are clear. There are small retention cysts in each maxillary antrum. No intraorbital lesions. IMPRESSION: Prior infarcts in the left parietal and occipital lobes, stable. Small prior infarct lateral right upper pons. No acute infarct evident. No hemorrhage or mass effect. Electronically Signed   By: Bretta BangWilliam  Woodruff III M.D.   On: 03/01/2015 13:27    Micro Results    No results found for this or any previous visit (from the past 240 hour(s)).     Today   Subjective:   Nicholas Rowe today has no headache,no chest abdominal pain,no new weakness tingling or numbness, feels much better wants to go home today. No further  seizures since admission.  Objective:   Blood pressure 144/66, pulse 66, temperature 98.9 F (37.2 C), temperature source Oral, resp. rate 17, height 5\' 6"  (1.676 m), weight 75.388 kg (166 lb 3.2 oz), SpO2 96 %.   Intake/Output Summary (Last 24 hours) at 03/02/15 1406 Last data filed at 03/02/15 0827  Gross per 24 hour  Intake 2621.38 ml  Output   1775 ml  Net 846.38 ml    Exam Awake Alert, Oriented x 3, No new F.N deficits, Normal affect .AT,PERRAL Supple Neck,No JVD, No cervical lymphadenopathy appriciated.  Symmetrical Chest wall movement, Good air movement bilaterally, CTAB RRR,No Gallops,Rubs or new Murmurs, No Parasternal Heave +ve B.Sounds, Abd Soft, Non tender, No organomegaly appriciated, No rebound -guarding or rigidity. No Cyanosis, Clubbing or edema, No new Rash or bruise  Data Review   CBC w Diff: Lab Results  Component Value Date   WBC 13.7* 03/01/2015   WBC 8.6 11/15/2011   HGB 13.2 03/01/2015   HGB 14.5 11/15/2011   HCT 38.9* 03/01/2015   HCT 41.1 11/15/2011   PLT 176  03/01/2015   PLT 192 11/15/2011    CMP: Lab Results  Component Value Date   NA 133* 03/01/2015   NA 131* 01/20/2015   NA 133* 11/15/2011   K 3.8 03/01/2015   K 3.9 11/15/2011   CL 105 03/01/2015   CL 99 11/15/2011   CO2 21* 03/01/2015   CO2 26 11/15/2011   BUN 11 03/01/2015   BUN 10 01/20/2015   BUN 9 11/15/2011   CREATININE 0.69 03/01/2015   CREATININE 0.65 11/15/2011   PROT 7.8 02/28/2015   ALBUMIN 4.7 02/28/2015   BILITOT 0.5 02/28/2015   ALKPHOS 76 02/28/2015   AST 38 02/28/2015   ALT 21 02/28/2015  .   Total Time in preparing paper work, data evaluation and todays exam - 35 minutes  Jasmane Brockway M.D on 03/02/2015 at 2:06 PM    Note: This dictation was prepared with Dragon dictation along with smaller phrase technology. Any transcriptional errors that result from this process are unintentional.

## 2015-03-02 NOTE — Progress Notes (Signed)
Patient discharged to home as ordered. Follow [facility] Patient is alert and oriented time 4, ambulates well with a cane. Patient taken to the car via wheelchair Wife at the bedside and also given discharge instructions.

## 2015-03-04 ENCOUNTER — Ambulatory Visit: Payer: 59

## 2015-03-09 ENCOUNTER — Telehealth: Payer: Self-pay | Admitting: Urology

## 2015-03-09 NOTE — Telephone Encounter (Signed)
[facility] pt's CT has been done.  I asked pt's wife if 12/20 was the first available appt for the CT and wife stated that she had to take him based on working around her work schedule.  Pt has been moved from 12/9 to 12/22 for his f/up appt in our office per pt's wife.

## 2015-03-11 ENCOUNTER — Ambulatory Visit: Payer: 59

## 2015-03-22 ENCOUNTER — Ambulatory Visit
Admission: RE | Admit: 2015-03-22 | Discharge: 2015-03-22 | Disposition: A | Discharge status: Home / Self Care | Payer: 59 | Source: Ambulatory Visit | Attending: Urology | Admitting: Urology

## 2015-03-22 DIAGNOSIS — R3129 Other microscopic hematuria: Secondary | ICD-10-CM | POA: Diagnosis not present

## 2015-03-22 DIAGNOSIS — I700 Atherosclerosis of aorta: Secondary | ICD-10-CM | POA: Diagnosis not present

## 2015-03-22 DIAGNOSIS — N323 Diverticulum of bladder: Secondary | ICD-10-CM | POA: Insufficient documentation

## 2015-03-24 ENCOUNTER — Ambulatory Visit (INDEPENDENT_AMBULATORY_CARE_PROVIDER_SITE_OTHER): Payer: 59 | Admitting: Urology

## 2015-03-24 ENCOUNTER — Encounter: Payer: Self-pay | Admitting: Urology

## 2015-03-24 ENCOUNTER — Ambulatory Visit: Payer: 59

## 2015-03-24 VITALS — BP 129/88 | HR 66 | Ht 67.0 in | Wt 171.2 lb

## 2015-03-24 DIAGNOSIS — R3129 Other microscopic hematuria: Secondary | ICD-10-CM | POA: Diagnosis not present

## 2015-03-24 DIAGNOSIS — N281 Cyst of kidney, acquired: Secondary | ICD-10-CM

## 2015-03-24 DIAGNOSIS — Q441 Other congenital malformations of gallbladder: Secondary | ICD-10-CM

## 2015-03-24 NOTE — Progress Notes (Signed)
03/24/2015 10:38 AM   Nicholas Rowe 09-06-1945 [id number]  Referring provider: Lonell Face, MD [phone] [facility] [address] [facility] [place], [place] [address]  Chief Complaint  Patient presents with  . Follow-up    diagnosistic procedure     HPI: The patient is a 69 year old man originally went to the hospital for flank pain at the time he was diagnosed with a 1 cm renal cyst. He presents to clinic today for follow-up. Also of note, he had 6-30 red blood cells on his urinalysis in the emergency department. He denies any other urinary symptoms with this time. He is never seen blood in his urine. He denies nocturia,frequency, urgency and weak stream.  Interval History: The patient returns to complete his hematuria work up. He underwent a cystoscopy colostomy his office which was unremarkable. He underwent a CT urogram which also was unremarkable since his last visit.   PMH: Past Medical History  Diagnosis Date  . Seizures (HCC)   . Colitis   . H/O chronic ulcerative colitis 10/01/2014  . 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  Home Medications:    Medication List       This list is accurate as of: 03/24/15 10:38 AM.  Always use your most recent med list.               DILANTIN 100 MG ER capsule  Generic drug:  phenytoin  TAKE 3 CAPSULES (300 MG TOTAL) BY MOUTH NIGHTLY.     donepezil 10 MG tablet  Commonly known as:  ARICEPT  Take by mouth.     KEPPRA XR 750 MG Tb24  Generic drug:  Levetiracetam  TAKE 2 TABLETS (1,500 MG TOTAL) BY MOUTH 2 (TWO) TIMES DAILY.     metoprolol tartrate 25 MG tablet  Commonly known as:  LOPRESSOR  Take by mouth.     MULTI-VITAMINS Tabs  Take by mouth.     oxyCODONE-acetaminophen 5-325 MG tablet  Commonly known as:  PERCOCET  Take 1 tablet by mouth every 4 (four) hours as needed for severe pain.     Vitamin D (Ergocalciferol) 50000 UNITS Caps  capsule  Commonly known as:  DRISDOL  Take by mouth.        Allergies: No Known Allergies  Family History: Family History  Problem Relation Age of Onset  . Hematuria Neg Hx   . Kidney cancer Neg Hx   . Bladder Cancer Neg Hx     Social History:  reports that he quit smoking about 29 years ago. He does not have any smokeless tobacco history on file. He reports that he does not drink alcohol or use illicit drugs.  ROS:                                        Physical Exam: Ht  (1.702 m)  Wt 171 lb 3.2 oz (77.656 kg)  BMI 26.81 kg/m2  Constitutional:  Alert and oriented, No acute distress. HEENT: [place] AT, moist mucus membranes.  Trachea midline, no masses. Cardiovascular: No clubbing, cyanosis, or edema. Respiratory: Normal respiratory effort, no increased work of breathing. GI: Abdomen is soft, nontender, nondistended, no abdominal masses GU: No CVA tenderness.  Skin: No rashes, bruises or suspicious lesions. Lymph: No cervical or inguinal adenopathy. Neurologic: Grossly intact, no focal deficits, moving all 4 extremities.  Psychiatric: Normal mood and affect.  Laboratory Data: Lab Results  Component Value Date   WBC 13.7* 03/01/2015   HGB 13.2 03/01/2015   HCT 38.9* 03/01/2015   MCV 90.6 03/01/2015   PLT 176 03/01/2015    Lab Results  Component Value Date   CREATININE 0.69 03/01/2015    No results found for: PSA  No results found for: TESTOSTERONE  No results found for: HGBA1C  Urinalysis    Component Value Date/Time   COLORURINE YELLOW* 12/26/2014 1116   APPEARANCEUR CLEAR* 12/26/2014 1116   LABSPEC 1.021 12/26/2014 1116   PHURINE 7.0 12/26/2014 1116   GLUCOSEU Negative 02/10/2015 1335   HGBUR 1+* 12/26/2014 1116   BILIRUBINUR Negative 02/10/2015 1335   BILIRUBINUR NEGATIVE 12/26/2014 1116   KETONESUR NEGATIVE 12/26/2014 1116   PROTEINUR NEGATIVE 12/26/2014 1116   NITRITE Negative 02/10/2015 1335   NITRITE NEGATIVE  12/26/2014 1116   LEUKOCYTESUR Negative 02/10/2015 1335   LEUKOCYTESUR NEGATIVE 12/26/2014 1116    Pertinent Imaging:  CLINICAL DATA: Microscopic hematuria.  EXAM: CT ABDOMEN AND PELVIS WITHOUT AND WITH CONTRAST  TECHNIQUE: Multidetector CT imaging of the abdomen and pelvis was performed following the standard protocol before and following the bolus administration of intravenous contrast.  CONTRAST: 125 cc Omnipaque  COMPARISON: CT 12/26/2014  FINDINGS: Lower chest: Lung bases are clear.  Hepatobiliary: 5 mm low-density lesion in the RIGHT hepatic lobe is unchanged. No duct dilatation. Mild thickening at the gallbladder fundus to 5 mm (image 29, series 4). No gallbladder inflammation or distention. Common bile normal. Normal gallbladder.  Pancreas: Pancreas is normal. No ductal dilatation. No pancreatic inflammation.  Spleen: Rounded low-density lesion within the spleen is unchanged in size measuring 20 mm. This faint rim of calcifications pints lesion which is also unchanged.  Adrenals/urinary tract: Adrenal glands are normal.  Non IV contrast images demonstrate no nephrolithiasis or ureterolithiasis. Cortical phase imaging demonstrates no enhancing renal cortical. Low-density lesion in the lower pole of the RIGHT kidney measures 10 mm with out post-contrast enhancement. Delayed pyelogram phase imaging demonstrates no filling defects within collecting systems or ureters.  No enhancing bladder lesion. There is a wide-mouth diverticulum extending from the anterior LEFT aspect of the bladder. No filling defect the bladder. New  Stomach/Bowel: Small hiatal hernia.  Vascular/Lymphatic: Abdominal aorta is normal caliber with atherosclerotic calcification. There is no retroperitoneal or periportal lymphadenopathy. No pelvic lymphadenopathy.  Reproductive: Prostate normal. Small bilateral inguinal hernias.  Other: No free  fluid.  Musculoskeletal: No aggressive osseous lesion.  IMPRESSION: 1. No explanation for hematuria. No nephrolithiasis, ureterolithiasis, enhancing renal cortical lesion, or filling defects within the collecting systems. 2. Bosniak 1 cyst of the RIGHT kidney. 3. No bladder abnormality other than a bladder diverticulum. 4. Atherosclerotic calcification of the abdominal aorta. 5. Focal thickening of the fundus the gallbladder. Indeterminate and may represent focal adenomyomatosis or redundant wall thickening. No comparison available therefore recommend followup CT in 6 months demonstrate stability. More specificity could be provided with an MRI of the abdomen with contrast. Lesion may be difficult to localize by ultrasound. 6. Stable cystic lesions in the spleen appear benign.   Electronically Signed  By: Genevive Bi M.D.  On: 03/22/2015 10:54      Result History     CT Abdomen Pelvis W Wo Contrast (Order #[id number]) on 03/22/2015 - Order Result History Report          Vitals     Height Weight BMI (Calculated)     (1.676 m) 166 lb  3.2 oz (75.388 kg) 26.9      Interpretation Summary     CLINICAL DATA: Microscopic hematuria.  EXAM: CT ABDOMEN AND PELVIS WITHOUT AND WITH CONTRAST  TECHNIQUE: Multidetector CT imaging of the abdomen and pelvis was performed following the standard protocol before and following the bolus administration of intravenous contrast.  CONTRAST: 125 cc Omnipaque  COMPARISON: CT 12/26/2014  FINDINGS: Lower chest: Lung bases are clear.  Hepatobiliary: 5 mm low-density lesion in the RIGHT hepatic lobe is unchanged. No duct dilatation. Mild thickening at the gallbladder fundus to 5 mm (image 29, series 4). No gallbladder inflammation or distention. Common bile normal. Normal gallbladder.  Pancreas: Pancreas is normal. No ductal dilatation. No pancreatic inflammation.  Spleen: Rounded low-density  lesion within the spleen is unchanged in size measuring 20 mm. This faint rim of calcifications pints lesion which is also unchanged.  Adrenals/urinary tract: Adrenal glands are normal.  Non IV contrast images demonstrate no nephrolithiasis or ureterolithiasis. Cortical phase imaging demonstrates no enhancing renal cortical. Low-density lesion in the lower pole of the RIGHT kidney measures 10 mm with out post-contrast enhancement. Delayed pyelogram phase imaging demonstrates no filling defects within collecting systems or ureters.  No enhancing bladder lesion. There is a wide-mouth diverticulum extending from the anterior LEFT aspect of the bladder. No filling defect the bladder. New  Stomach/Bowel: Small hiatal hernia.  Vascular/Lymphatic: Abdominal aorta is normal caliber with atherosclerotic calcification. There is no retroperitoneal or periportal lymphadenopathy. No pelvic lymphadenopathy.  Reproductive: Prostate normal. Small bilateral inguinal hernias.  Other: No free fluid.  Musculoskeletal: No aggressive osseous lesion.  IMPRESSION: 1. No explanation for hematuria. No nephrolithiasis, ureterolithiasis, enhancing renal cortical lesion, or filling defects within the collecting systems. 2. Bosniak 1 cyst of the RIGHT kidney. 3. No bladder abnormality other than a bladder diverticulum. 4. Atherosclerotic calcification of the abdominal aorta. 5. Focal thickening of the fundus the gallbladder. Indeterminate and may represent focal adenomyomatosis or redundant wall thickening. No comparison available therefore recommend followup CT in 6 months demonstrate stability. More specificity could be provided with an MRI of the abdomen with contrast. Lesion may be difficult to localize by ultrasound. 6. Stable cystic lesions in the spleen appear benign     Assessment & Plan:     1. Asymptomatic microhematuria Patient a negative hematuria workup. He will follow-up  in one year's time for urinalysis.   2. Bosniak 1 right renal cyst This is benign. No further workup is needed.  3. Focal thickening of the gallbladder The patient was given a copy of his CT report, and he was advised that he will need follow-up imaging in 6 months per the radiologist. He will take the results to his primary care physician to further discuss.  Return in about 1 year (around 03/11/2016) for urinalysis.  Hildred LaserBrian Izekiel Adriane Guglielmo, MD  [facility] [address], [address] [place], [place] [address] [phone]

## 2015-10-05 ENCOUNTER — Other Ambulatory Visit: Payer: Self-pay | Admitting: Family Medicine

## 2015-10-05 DIAGNOSIS — K828 Other specified diseases of gallbladder: Secondary | ICD-10-CM

## 2015-10-07 ENCOUNTER — Ambulatory Visit: Payer: 59

## 2015-10-14 ENCOUNTER — Ambulatory Visit
Admission: RE | Admit: 2015-10-14 | Discharge: 2015-10-14 | Disposition: A | Discharge status: Home / Self Care | Payer: 59 | Source: Ambulatory Visit | Attending: Family Medicine | Admitting: Family Medicine

## 2015-10-14 DIAGNOSIS — I700 Atherosclerosis of aorta: Secondary | ICD-10-CM | POA: Insufficient documentation

## 2015-10-14 DIAGNOSIS — D739 Disease of spleen, unspecified: Secondary | ICD-10-CM | POA: Diagnosis not present

## 2015-10-14 DIAGNOSIS — N281 Cyst of kidney, acquired: Secondary | ICD-10-CM | POA: Insufficient documentation

## 2015-10-14 DIAGNOSIS — K862 Cyst of pancreas: Secondary | ICD-10-CM | POA: Insufficient documentation

## 2015-10-14 DIAGNOSIS — K828 Other specified diseases of gallbladder: Secondary | ICD-10-CM

## 2015-10-14 DIAGNOSIS — I708 Atherosclerosis of other arteries: Secondary | ICD-10-CM | POA: Insufficient documentation

## 2015-10-14 MED ORDER — IOPAMIDOL (ISOVUE-370) INJECTION 76%
100.0000 mL | Freq: Once | INTRAVENOUS | Status: AC | PRN
  Administered 2015-10-14: 100 mL via INTRAVENOUS

## 2016-02-04 IMAGING — CT CT RENAL STONE PROTOCOL
1 of 2 series · 15 of 32 positions shown, 19 images · non-contrast
Comparison: None.

CLINICAL DATA: Right flank pain, hematuria

EXAM:
CT ABDOMEN AND PELVIS WITHOUT CONTRAST
TECHNIQUE: Multidetector CT imaging of the abdomen and pelvis was performed
following the standard protocol without IV contrast.

[Series 2: stone standard full · axial · 0.74mm/px · z∈[-998,-598]mm · 15 of 88 slices shown, 19 images]
[im 4/88  soft-tissue]
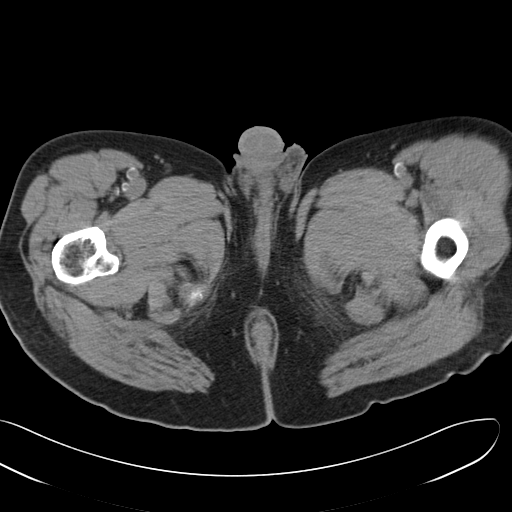
[im 4/88  bone]
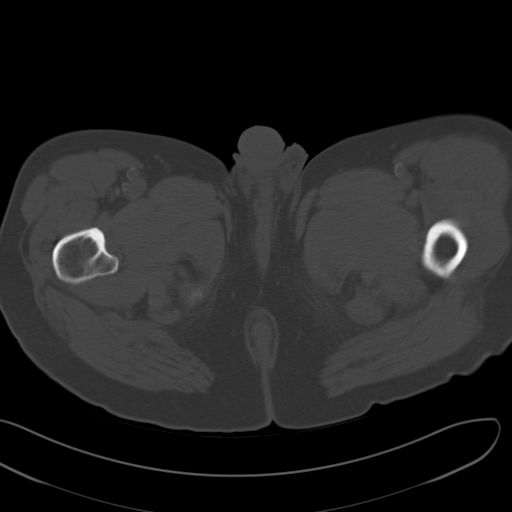
[im 11/88  soft-tissue]
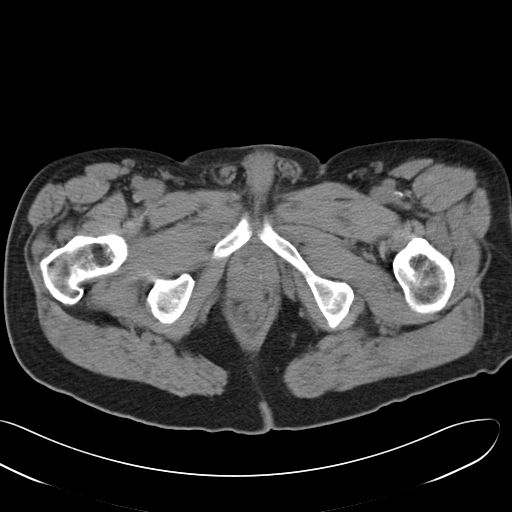
[im 19/88  soft-tissue]
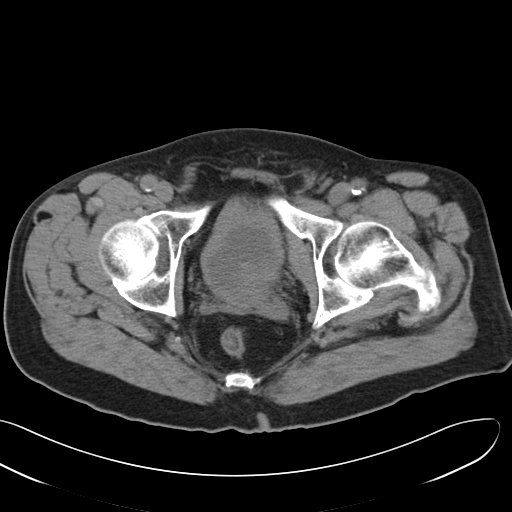
[im 26/88  soft-tissue]
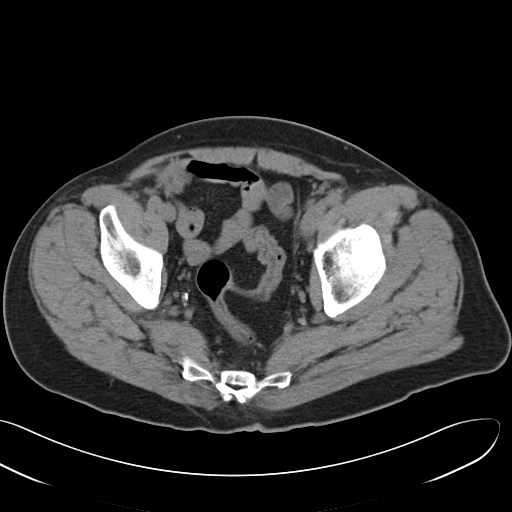
[im 30/88  soft-tissue]
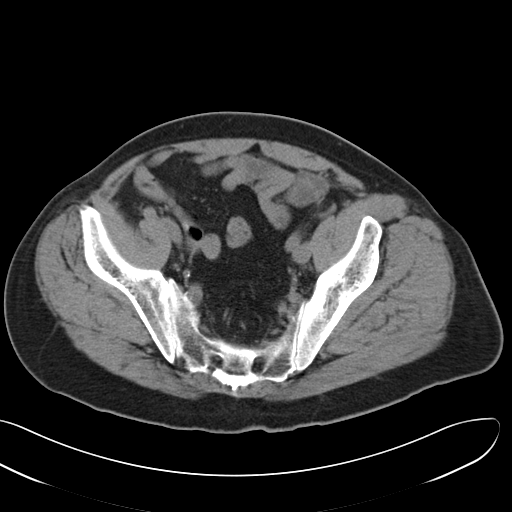
[im 37/88  soft-tissue]
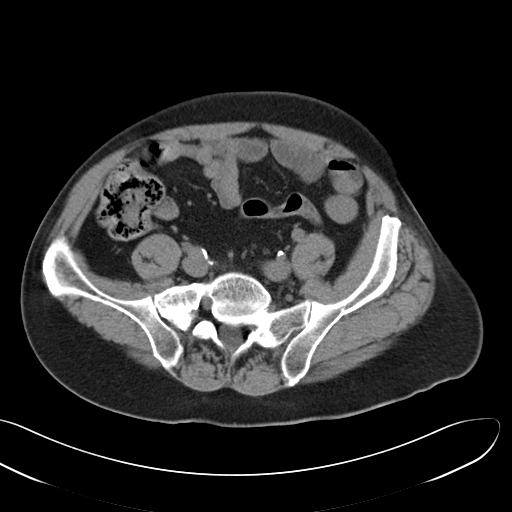
[im 44/88  soft-tissue]
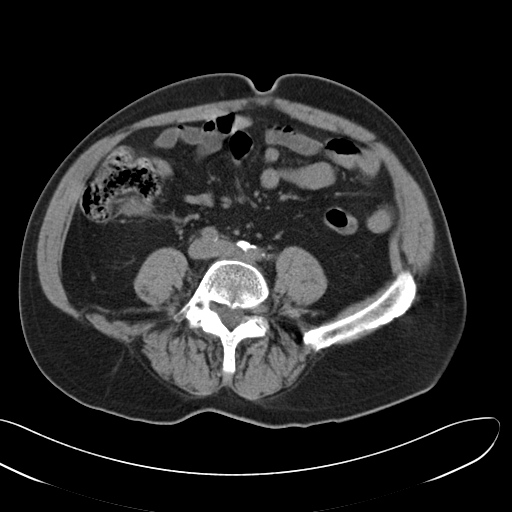
[im 51/88  soft-tissue]
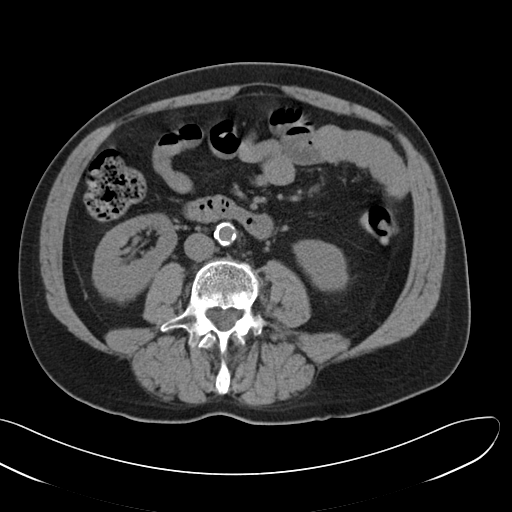
[im 59/88  soft-tissue]
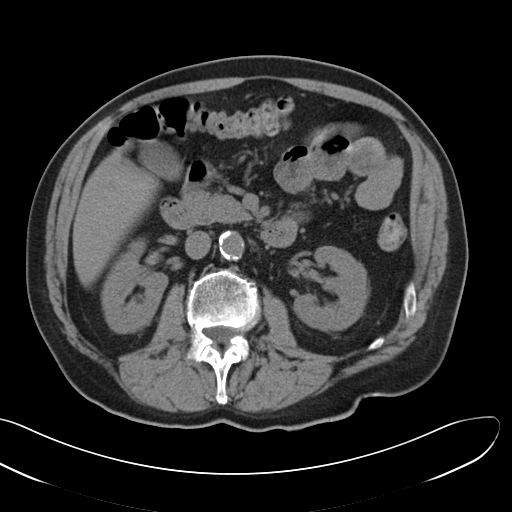
[im 59/88  bone]
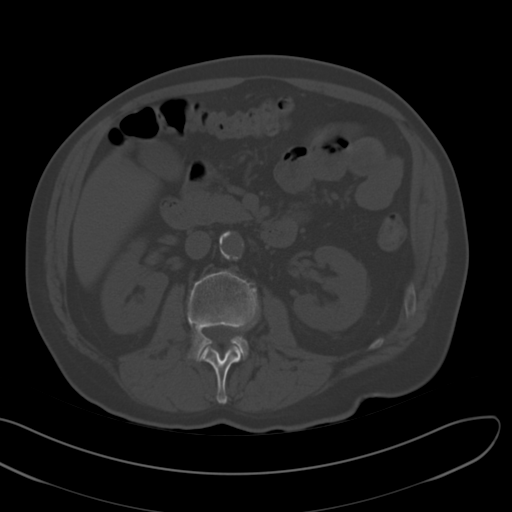
[im 62/88  soft-tissue]
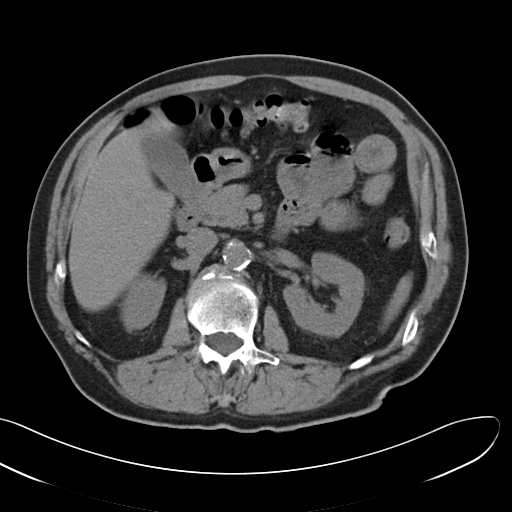
[im 69/88  soft-tissue]
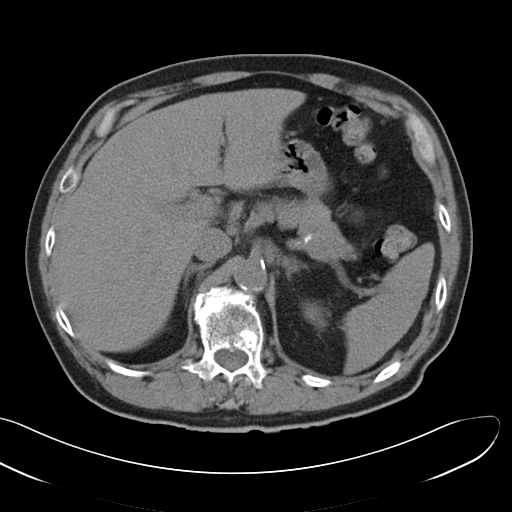
[im 73/88  lung]
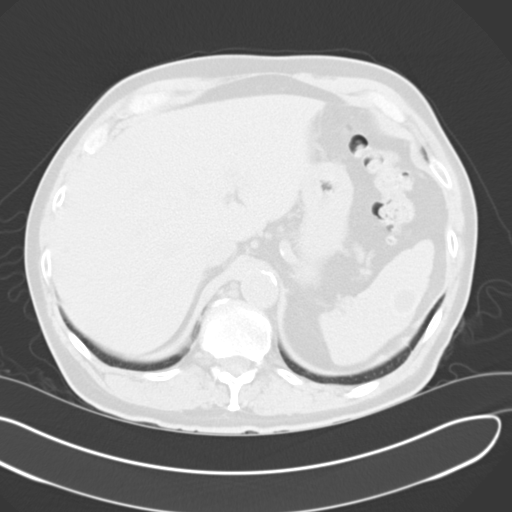
[im 77/88  soft-tissue]
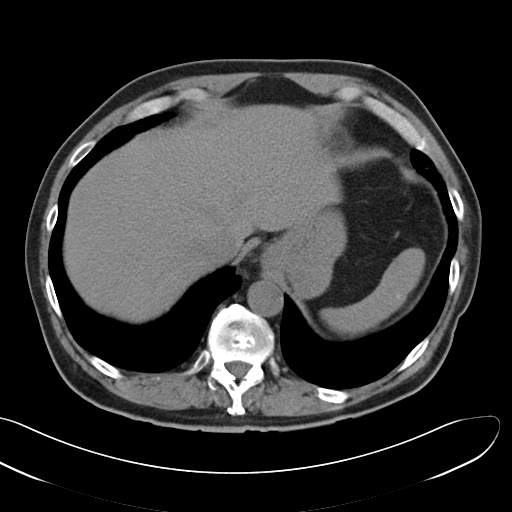
[im 77/88  lung]
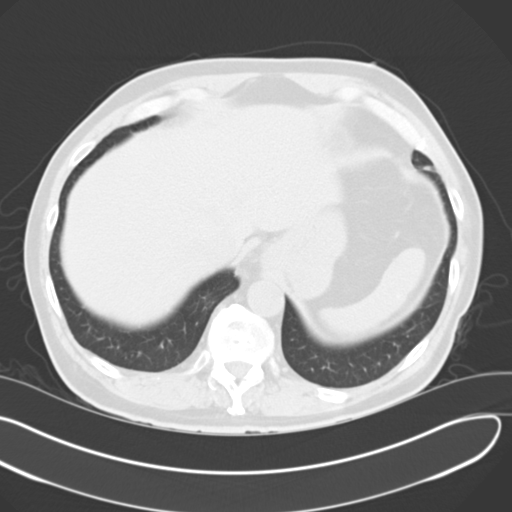
[im 80/88  lung]
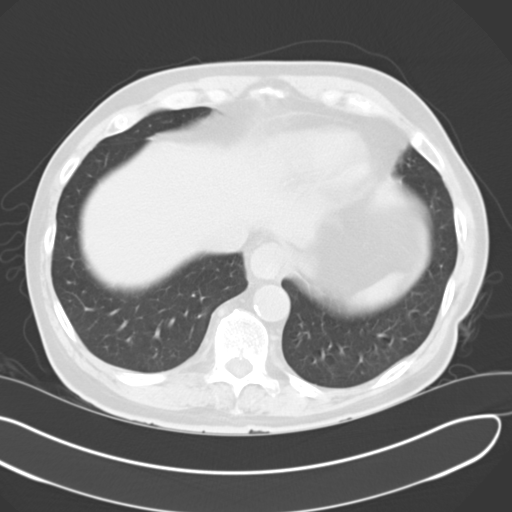
[im 84/88  soft-tissue]
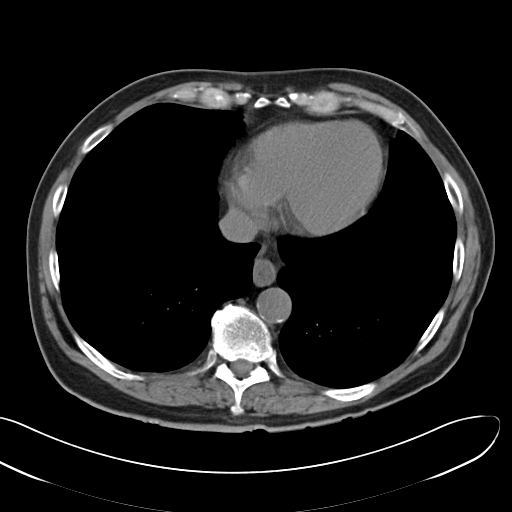
[im 84/88  lung]
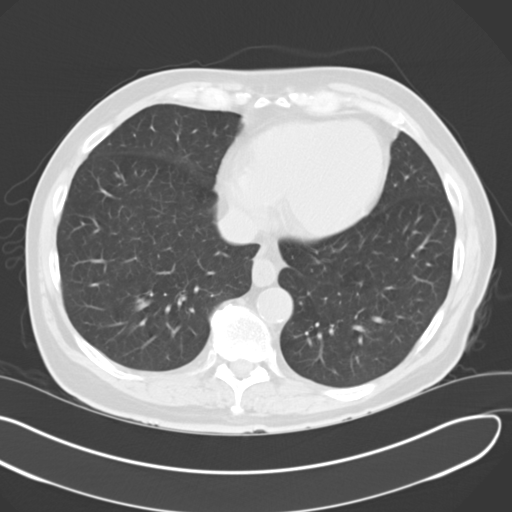

[15 of 32 positions shown; findings below may reference images not displayed]

FINDINGS: Lower chest:  Lung bases are clear.

Hepatobiliary: Unenhanced liver is unremarkable.

Gallbladder is unremarkable. No intrahepatic or extrahepatic ductal
dilatation.

Pancreas: Within normal limits.

Spleen: 2.3 cm rim calcified splenic cyst (series 2/ image 17),
benign.

Adrenals/Urinary Tract: Adrenal glands are within normal limits.

10 mm cysts in the lateral right lower kidney (series 2/ image 42).
Kidneys are otherwise within normal limits.

No renal, ureteral, or bladder calculi.  No hydronephrosis.

Irregular anterior bladder wall thickening (series 2/ image 69).

Stomach/Bowel: Stomach is notable for a tiny hiatal hernia.

No evidence of bowel obstruction.

Normal appendix.

Vascular/Lymphatic: Atherosclerotic calcifications of the abdominal
aorta and branch vessels.

No suspicious abdominopelvic lymphadenopathy.

Reproductive: Prostatomegaly.

Other: No abdominopelvic ascites.

Musculoskeletal: Degenerative changes of the visualized
thoracolumbar spine.
IMPRESSION: No renal, ureteral, or bladder calculi.  No hydronephrosis.

Irregular anterior bladder wall thickening. In the setting of
hematuria, consider cystoscopy for further evaluation.

No evidence of bowel obstruction.  Normal appendix.

Prostatomegaly.

## 2016-03-22 ENCOUNTER — Ambulatory Visit (INDEPENDENT_AMBULATORY_CARE_PROVIDER_SITE_OTHER): Payer: 59 | Admitting: Urology

## 2016-03-22 VITALS — BP 120/71 | HR 72 | Ht 68.0 in | Wt 168.4 lb

## 2016-03-22 DIAGNOSIS — Z125 Encounter for screening for malignant neoplasm of prostate: Secondary | ICD-10-CM

## 2016-03-22 DIAGNOSIS — N281 Cyst of kidney, acquired: Secondary | ICD-10-CM

## 2016-03-22 DIAGNOSIS — R3129 Other microscopic hematuria: Secondary | ICD-10-CM

## 2016-03-22 DIAGNOSIS — N400 Enlarged prostate without lower urinary tract symptoms: Secondary | ICD-10-CM | POA: Diagnosis not present

## 2016-03-22 LAB — URINALYSIS, COMPLETE
BILIRUBIN UA: NEGATIVE
Glucose, UA: NEGATIVE
KETONES UA: NEGATIVE
Nitrite, UA: NEGATIVE
PROTEIN UA: NEGATIVE
SPEC GRAV UA: 1.015 (ref 1.005–1.030)
Urobilinogen, Ur: 0.2 mg/dL (ref 0.2–1.0)
pH, UA: 7 (ref 5.0–7.5)

## 2016-03-22 LAB — MICROSCOPIC EXAMINATION

## 2016-03-22 NOTE — Progress Notes (Signed)
03/22/2016 11:18 AM   Nicholas Rowe 04/23/1945 [id number]  Referring provider: Lonell FaceHemang K Shah, MD [phone] [facility] [address] [facility] [place], [place] [address]  Chief Complaint  Patient presents with  . Follow-up    microscopic hematuria     HPI: The patient is a 70 year old male who presents today for her annual follow-up.  1. Asymptomatic microhematuria Negative work up in December 2016. 3-10 RBC on U/A today.  2. Bosniak 1 right renal cyst This is benign. No further workup is needed.    3. Prostate cancer screening Due for DRE/PSA.  Last PSA was in 10/2014. PSA was 1.84.  4. BPH Minimal symptoms. No nocturia. Feels like he empties his bladder. Good stream. No urgency or frequency.  PMH: Past Medical History:  Diagnosis Date  . Colitis   . H/O chronic ulcerative colitis 10/01/2014  . 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 Nausea & vomiting 02/28/2015  . Seizures (HCC)     Surgical History: No past surgical history on file.  Home Medications:  Allergies as of 03/22/2016   No Known Allergies     Medication List       Accurate as of 03/22/16 11:18 AM. Always use your most recent med list.          DILANTIN 100 MG ER capsule Generic drug:  phenytoin TAKE 3 CAPSULES (300 MG TOTAL) BY MOUTH NIGHTLY.   donepezil 10 MG tablet Commonly known as:  ARICEPT Take by mouth.   KEPPRA XR 750 MG Tb24 Generic drug:  Levetiracetam TAKE 2 TABLETS (1,500 MG TOTAL) BY MOUTH 2 (TWO) TIMES DAILY.   metoprolol tartrate 25 MG tablet Commonly known as:  LOPRESSOR Take by mouth.   MULTI-VITAMINS Tabs Take by mouth.   Vitamin D (Ergocalciferol) 50000 units Caps capsule Commonly known as:  DRISDOL Take by mouth.       Allergies: No Known Allergies  Family History: Family History  Problem Relation Age of Onset  . Hematuria Neg Hx   . Kidney cancer Neg Hx   . Bladder Cancer Neg Hx     Social History:  reports that he quit  smoking about 30 years ago. He does not have any smokeless tobacco history on file. He reports that he does not drink alcohol or use drugs.  ROS:                                        Physical Exam: BP 120/71   Pulse 72   Ht 5\' 8"  (1.727 m)   Wt 168 lb 6.4 oz (76.4 kg)   BMI 25.61 kg/m   Constitutional:  Alert and oriented, No acute distress. HEENT: [place] AT, moist mucus membranes.  Trachea midline, no masses. Cardiovascular: No clubbing, cyanosis, or edema. Respiratory: Normal respiratory effort, no increased work of breathing. GI: Abdomen is soft, nontender, nondistended, no abdominal masses GU: No CVA tenderness. DRE: 2+ benign Skin: No rashes, bruises or suspicious lesions. Lymph: No cervical or inguinal adenopathy. Neurologic: Grossly intact, no focal deficits, moving all 4 extremities. Psychiatric: Normal mood and affect.  Laboratory Data: Lab Results  Component Value Date   WBC 13.7 (H) 03/01/2015   HGB 13.2 03/01/2015   HCT 38.9 (L) 03/01/2015   MCV 90.6 03/01/2015   PLT 176 03/01/2015    Lab Results  Component Value Date   CREATININE 0.69 03/01/2015    No  results found for: PSA  No results found for: TESTOSTERONE  No results found for: HGBA1C  Urinalysis    Component Value Date/Time   COLORURINE YELLOW (A) 12/26/2014 1116   APPEARANCEUR Clear 02/10/2015 1335   LABSPEC 1.021 12/26/2014 1116   PHURINE 7.0 12/26/2014 1116   GLUCOSEU Negative 02/10/2015 1335   HGBUR 1+ (A) 12/26/2014 1116   BILIRUBINUR Negative 02/10/2015 1335   KETONESUR NEGATIVE 12/26/2014 1116   PROTEINUR Negative 02/10/2015 1335   PROTEINUR NEGATIVE 12/26/2014 1116   NITRITE Negative 02/10/2015 1335   NITRITE NEGATIVE 12/26/2014 1116   LEUKOCYTESUR Negative 02/10/2015 1335     Assessment & Plan:    1. Asymptomatic microhematuria Patient had a negative hematuria workup in 2016. Persistent on U/A today. He will follow-up in one year's time for urinalysis.     2. Bosniak 1 right renal cyst This is benign. No further workup is needed.  3. BPH Asymptomatic  4. Prostate cancer screening Due for PSA today  Return in about 1 year (around 03/22/2017).  Hildred LaserBrian Taliesin Zetha Kuhar, MD  [facility] [address], [address] [place], [place] [address] [phone]

## 2016-03-23 ENCOUNTER — Inpatient Hospital Stay: Payer: 59

## 2016-03-23 ENCOUNTER — Inpatient Hospital Stay
Admission: EM | Admit: 2016-03-23 | Discharge: 2016-04-02 | DRG: 296 | Disposition: E | Payer: 59 | Attending: Pulmonary Disease | Admitting: Pulmonary Disease

## 2016-03-23 ENCOUNTER — Encounter: Payer: Self-pay | Admitting: Radiology

## 2016-03-23 ENCOUNTER — Emergency Department: Payer: 59

## 2016-03-23 DIAGNOSIS — Z87891 Personal history of nicotine dependence: Secondary | ICD-10-CM | POA: Diagnosis not present

## 2016-03-23 DIAGNOSIS — G253 Myoclonus: Secondary | ICD-10-CM | POA: Diagnosis not present

## 2016-03-23 DIAGNOSIS — G40309 Generalized idiopathic epilepsy and epileptic syndromes, not intractable, without status epilepticus: Secondary | ICD-10-CM | POA: Diagnosis present

## 2016-03-23 DIAGNOSIS — I469 Cardiac arrest, cause unspecified: Secondary | ICD-10-CM | POA: Diagnosis present

## 2016-03-23 DIAGNOSIS — Z91128 Patient's intentional underdosing of medication regimen for other reason: Secondary | ICD-10-CM | POA: Diagnosis not present

## 2016-03-23 DIAGNOSIS — G40901 Epilepsy, unspecified, not intractable, with status epilepticus: Secondary | ICD-10-CM

## 2016-03-23 DIAGNOSIS — R569 Unspecified convulsions: Secondary | ICD-10-CM | POA: Diagnosis present

## 2016-03-23 DIAGNOSIS — T426X6A Underdosing of other antiepileptic and sedative-hypnotic drugs, initial encounter: Secondary | ICD-10-CM | POA: Diagnosis present

## 2016-03-23 DIAGNOSIS — R0902 Hypoxemia: Secondary | ICD-10-CM

## 2016-03-23 DIAGNOSIS — Z95828 Presence of other vascular implants and grafts: Secondary | ICD-10-CM | POA: Diagnosis not present

## 2016-03-23 DIAGNOSIS — E872 Acidosis: Secondary | ICD-10-CM | POA: Diagnosis present

## 2016-03-23 DIAGNOSIS — R1110 Vomiting, unspecified: Secondary | ICD-10-CM

## 2016-03-23 DIAGNOSIS — J9690 Respiratory failure, unspecified, unspecified whether with hypoxia or hypercapnia: Secondary | ICD-10-CM

## 2016-03-23 DIAGNOSIS — Z515 Encounter for palliative care: Secondary | ICD-10-CM | POA: Diagnosis not present

## 2016-03-23 DIAGNOSIS — N179 Acute kidney failure, unspecified: Secondary | ICD-10-CM | POA: Diagnosis not present

## 2016-03-23 DIAGNOSIS — J9600 Acute respiratory failure, unspecified whether with hypoxia or hypercapnia: Secondary | ICD-10-CM | POA: Diagnosis present

## 2016-03-23 DIAGNOSIS — J690 Pneumonitis due to inhalation of food and vomit: Secondary | ICD-10-CM

## 2016-03-23 DIAGNOSIS — G936 Cerebral edema: Secondary | ICD-10-CM | POA: Diagnosis present

## 2016-03-23 DIAGNOSIS — G931 Anoxic brain damage, not elsewhere classified: Secondary | ICD-10-CM | POA: Diagnosis not present

## 2016-03-23 DIAGNOSIS — Z66 Do not resuscitate: Secondary | ICD-10-CM | POA: Diagnosis not present

## 2016-03-23 DIAGNOSIS — J1520 Pneumonia due to staphylococcus, unspecified: Secondary | ICD-10-CM | POA: Diagnosis present

## 2016-03-23 DIAGNOSIS — Z0189 Encounter for other specified special examinations: Secondary | ICD-10-CM

## 2016-03-23 DIAGNOSIS — Z452 Encounter for adjustment and management of vascular access device: Secondary | ICD-10-CM

## 2016-03-23 LAB — PROTIME-INR
INR: 1.27
INR: 1.09
PROTHROMBIN TIME: 16 s — AB (ref 11.4–15.2)
Prothrombin Time: 14.1 seconds (ref 11.4–15.2)

## 2016-03-23 LAB — TYPE AND SCREEN
ABO/RH(D): O NEG
ANTIBODY SCREEN: NEGATIVE

## 2016-03-23 LAB — TRIGLYCERIDES: TRIGLYCERIDES: 148 mg/dL (ref <150)

## 2016-03-23 LAB — GLUCOSE, CAPILLARY
GLUCOSE-CAPILLARY: 209 mg/dL — AB (ref 65–99)
GLUCOSE-CAPILLARY: 113 mg/dL — AB (ref 65–99)
Glucose-Capillary: 265 mg/dL — ABNORMAL HIGH (ref 65–99)
Glucose-Capillary: 100 mg/dL — ABNORMAL HIGH (ref 65–99)
Glucose-Capillary: 111 mg/dL — ABNORMAL HIGH (ref 65–99)

## 2016-03-23 LAB — BASIC METABOLIC PANEL
ANION GAP: 13 (ref 5–15)
BUN: 15 mg/dL (ref 6–20)
CO2: 18 mmol/L — AB (ref 22–32)
Calcium: 7.9 mg/dL — ABNORMAL LOW (ref 8.9–10.3)
Chloride: 100 mmol/L — ABNORMAL LOW (ref 101–111)
Creatinine, Ser: 0.97 mg/dL (ref 0.61–1.24)
GFR calc Af Amer: >60 mL/min (ref >60)
GFR calc non Af Amer: >60 mL/min (ref >60)
GLUCOSE: 237 mg/dL — AB (ref 65–99)
POTASSIUM: 3.7 mmol/L (ref 3.5–5.1)
Sodium: 131 mmol/L — ABNORMAL LOW (ref 135–145)

## 2016-03-23 LAB — CBC WITH DIFFERENTIAL/PLATELET
BASOS ABS: 0.1 10*3/uL (ref 0–0.1)
BASOS PCT: 1 %
Eosinophils Absolute: 0.2 10*3/uL (ref 0–0.7)
Eosinophils Relative: 2 %
HEMATOCRIT: 41.2 % (ref 40.0–52.0)
HEMOGLOBIN: 13.7 g/dL (ref 13.0–18.0)
Lymphocytes Relative: 41 %
Lymphs Abs: 3.8 10*3/uL — ABNORMAL HIGH (ref 1.0–3.6)
MCH: 32.4 pg (ref 26.0–34.0)
MCHC: 33.2 g/dL (ref 32.0–36.0)
MCV: 97.6 fL (ref 80.0–100.0)
MONO ABS: 0.6 10*3/uL (ref 0.2–1.0)
Monocytes Relative: 6 %
NEUTROS ABS: 4.7 10*3/uL (ref 1.4–6.5)
NEUTROS PCT: 50 %
Platelets: 220 10*3/uL (ref 150–440)
RBC: 4.22 MIL/uL — AB (ref 4.40–5.90)
RDW: 13.7 % (ref 11.5–14.5)
WBC: 9.3 10*3/uL (ref 3.8–10.6)

## 2016-03-23 LAB — COMPREHENSIVE METABOLIC PANEL
ALBUMIN: 3.9 g/dL (ref 3.5–5.0)
ALK PHOS: 82 U/L (ref 38–126)
ALT: 119 U/L — AB (ref 17–63)
AST: 137 U/L — ABNORMAL HIGH (ref 15–41)
Anion gap: 22 — ABNORMAL HIGH (ref 5–15)
BUN: 13 mg/dL (ref 6–20)
CO2: 11 mmol/L — AB (ref 22–32)
Calcium: 8.7 mg/dL — ABNORMAL LOW (ref 8.9–10.3)
Chloride: 99 mmol/L — ABNORMAL LOW (ref 101–111)
Creatinine, Ser: 1.09 mg/dL (ref 0.61–1.24)
GFR calc non Af Amer: >60 mL/min (ref >60)
GLUCOSE: 277 mg/dL — AB (ref 65–99)
Potassium: 2.9 mmol/L — ABNORMAL LOW (ref 3.5–5.1)
SODIUM: 132 mmol/L — AB (ref 135–145)
TOTAL PROTEIN: 6.7 g/dL (ref 6.5–8.1)
Total Bilirubin: 0.6 mg/dL (ref 0.3–1.2)

## 2016-03-23 LAB — BLOOD GAS, ARTERIAL
ALLENS TEST (PASS/FAIL): POSITIVE — AB
Acid-base deficit: 17.3 mmol/L — ABNORMAL HIGH (ref 0.0–2.0)
Bicarbonate: 11.4 mmol/L — ABNORMAL LOW (ref 20.0–28.0)
FIO2: 0.9
LHR: 20 {breaths}/min
MECHVT: 550 mL
O2 SAT: 100 %
PATIENT TEMPERATURE: 37
PCO2 ART: 36 mmHg (ref 32.0–48.0)
PEEP/CPAP: 5 cmH2O
PO2 ART: 516 mmHg — AB (ref 83.0–108.0)
pH, Arterial: 7.11 — CL (ref 7.350–7.450)

## 2016-03-23 LAB — PHENYTOIN LEVEL, TOTAL: Phenytoin Lvl: 9.6 ug/mL — ABNORMAL LOW (ref 10.0–20.0)

## 2016-03-23 LAB — LACTIC ACID, PLASMA: LACTIC ACID, VENOUS: 13.8 mmol/L — AB (ref 0.5–1.9)

## 2016-03-23 LAB — ETHANOL

## 2016-03-23 LAB — MRSA PCR SCREENING: MRSA by PCR: NEGATIVE

## 2016-03-23 LAB — TROPONIN I
Troponin I: 0.28 ng/mL (ref <0.03)
Troponin I: 0.93 ng/mL (ref <0.03)

## 2016-03-23 LAB — PSA TOTAL (REFLEX TO FREE): PROSTATE SPECIFIC AG, SERUM: 2.2 ng/mL (ref 0.0–4.0)

## 2016-03-23 LAB — APTT: APTT: 32 s (ref 24–36)

## 2016-03-23 MED ORDER — LEVETIRACETAM 500 MG/5ML IV SOLN
500.0000 mg | Freq: Once | INTRAVENOUS | Status: AC
Start: 2016-03-23 — End: 2016-03-23
  Administered 2016-03-23: 500 mg via INTRAVENOUS
  Filled 2016-03-23: qty 5

## 2016-03-23 MED ORDER — ORAL CARE MOUTH RINSE: 15.0000 mL | Freq: Four times a day (QID) | OROMUCOSAL | Status: DC

## 2016-03-23 MED ORDER — LORAZEPAM 2 MG/ML IJ SOLN
1.0000 mg | Freq: Once | INTRAMUSCULAR | Status: AC
  Administered 2016-03-23: 1 mg via INTRAVENOUS

## 2016-03-23 MED ORDER — MIDAZOLAM HCL 2 MG/2ML IJ SOLN
INTRAMUSCULAR | Status: AC
  Administered 2016-03-23: 2 mg via INTRAVENOUS
  Filled 2016-03-23: qty 2

## 2016-03-23 MED ORDER — MIDAZOLAM HCL 2 MG/2ML IJ SOLN
2.0000 mg | Freq: Once | INTRAMUSCULAR | Status: AC
  Administered 2016-03-23: 2 mg via INTRAVENOUS

## 2016-03-23 MED ORDER — SODIUM CHLORIDE 0.9 % IV SOLN
125.0000 mg | Freq: Three times a day (TID) | INTRAVENOUS | Status: DC
  Administered 2016-03-23 – 2016-03-26 (×9): 125 mg via INTRAVENOUS
  Filled 2016-03-23 (×16): qty 2.5

## 2016-03-23 MED ORDER — PROPOFOL 1000 MG/100ML IV EMUL
5.0000 ug/kg/min | Freq: Once | INTRAVENOUS | Status: AC
  Administered 2016-03-23: 10 ug/kg/min via INTRAVENOUS

## 2016-03-23 MED ORDER — NOREPINEPHRINE BITARTRATE 1 MG/ML IV SOLN
0.0000 ug/min | INTRAVENOUS | Status: DC
  Filled 2016-03-23: qty 4

## 2016-03-23 MED ORDER — CHLORHEXIDINE GLUCONATE 0.12% ORAL RINSE (MEDLINE KIT)
15.0000 mL | Freq: Two times a day (BID) | OROMUCOSAL | Status: DC
  Administered 2016-03-23 – 2016-03-26 (×6): 15 mL via OROMUCOSAL

## 2016-03-23 MED ORDER — LACTATED RINGERS IV SOLN
INTRAVENOUS | Status: DC
  Administered 2016-03-23: 15:00:00 via INTRAVENOUS
  Administered 2016-03-24 (×2): 75 mL/h via INTRAVENOUS

## 2016-03-23 MED ORDER — SODIUM CHLORIDE 0.9 % IV SOLN
1000.0000 mg | Freq: Once | INTRAVENOUS | Status: AC
  Administered 2016-03-23: 1000 mg via INTRAVENOUS
  Filled 2016-03-23: qty 10

## 2016-03-23 MED ORDER — PROPOFOL 1000 MG/100ML IV EMUL
0.0000 ug/kg/min | INTRAVENOUS | Status: DC
  Administered 2016-03-23: 79.453 ug/kg/min via INTRAVENOUS
  Administered 2016-03-23: 80 ug/kg/min via INTRAVENOUS
  Administered 2016-03-23: 79.453 ug/kg/min via INTRAVENOUS
  Administered 2016-03-23: 80 ug/kg/min via INTRAVENOUS
  Administered 2016-03-24: 49.632 ug/kg/min via INTRAVENOUS
  Administered 2016-03-24 (×5): 80 ug/kg/min via INTRAVENOUS
  Administered 2016-03-24: 79.453 ug/kg/min via INTRAVENOUS
  Administered 2016-03-24: 80 ug/kg/min via INTRAVENOUS
  Administered 2016-03-24: 79.453 ug/kg/min via INTRAVENOUS
  Administered 2016-03-25: 80 ug/kg/min via INTRAVENOUS
  Administered 2016-03-25: 8 ug/kg/min via INTRAVENOUS
  Administered 2016-03-25 (×4): 80 ug/kg/min via INTRAVENOUS
  Administered 2016-03-26: 70 ug/kg/min via INTRAVENOUS
  Administered 2016-03-26: 80 ug/kg/min via INTRAVENOUS
  Administered 2016-03-26 (×2): 70 ug/kg/min via INTRAVENOUS
  Filled 2016-03-23 (×26): qty 100

## 2016-03-23 MED ORDER — VALPROATE SODIUM 500 MG/5ML IV SOLN
500.0000 mg | Freq: Two times a day (BID) | INTRAVENOUS | Status: DC
  Administered 2016-03-23 – 2016-03-26 (×6): 500 mg via INTRAVENOUS
  Filled 2016-03-23 (×9): qty 5

## 2016-03-23 MED ORDER — SODIUM CHLORIDE 0.9 % IV SOLN
INTRAVENOUS | Status: DC
  Administered 2016-03-23: 16:00:00 via INTRAVENOUS

## 2016-03-23 MED ORDER — SODIUM CHLORIDE 0.9 % IV SOLN
250.0000 mg | Freq: Once | INTRAVENOUS | Status: AC
  Administered 2016-03-23: 250 mg via INTRAVENOUS
  Filled 2016-03-23: qty 5

## 2016-03-23 MED ORDER — ORAL CARE MOUTH RINSE
15.0000 mL | OROMUCOSAL | Status: DC
  Administered 2016-03-23 – 2016-03-26 (×28): 15 mL via OROMUCOSAL

## 2016-03-23 MED ORDER — DEXTROSE 5 % IV SOLN
1250.0000 mg | Freq: Once | INTRAVENOUS | Status: AC
  Administered 2016-03-23: 1250 mg via INTRAVENOUS
  Filled 2016-03-23: qty 12.5

## 2016-03-23 MED ORDER — LORAZEPAM 2 MG/ML IJ SOLN
1.0000 mg | INTRAMUSCULAR | Status: DC | PRN
  Administered 2016-03-23: 2 mg via INTRAVENOUS
  Administered 2016-03-23 – 2016-03-25 (×6): 4 mg via INTRAVENOUS
  Filled 2016-03-23 (×6): qty 2

## 2016-03-23 MED ORDER — SODIUM CHLORIDE 0.9 % IV SOLN
500.0000 mg | Freq: Once | INTRAVENOUS | Status: AC
  Administered 2016-03-23: 500 mg via INTRAVENOUS
  Filled 2016-03-23: qty 10

## 2016-03-23 MED ORDER — ENOXAPARIN SODIUM 40 MG/0.4ML ~~LOC~~ SOLN
40.0000 mg | SUBCUTANEOUS | Status: DC
  Administered 2016-03-23 – 2016-03-25 (×3): 40 mg via SUBCUTANEOUS
  Filled 2016-03-23 (×3): qty 0.4

## 2016-03-23 MED ORDER — FAMOTIDINE IN NACL 20-0.9 MG/50ML-% IV SOLN
20.0000 mg | Freq: Two times a day (BID) | INTRAVENOUS | Status: DC
  Administered 2016-03-23 – 2016-03-26 (×7): 20 mg via INTRAVENOUS
  Filled 2016-03-23 (×7): qty 50

## 2016-03-23 MED ORDER — SODIUM CHLORIDE 0.9 % IV SOLN
1500.0000 mg | Freq: Two times a day (BID) | INTRAVENOUS | Status: DC
  Administered 2016-03-23 – 2016-03-26 (×6): 1500 mg via INTRAVENOUS
  Filled 2016-03-23 (×9): qty 15

## 2016-03-23 MED ORDER — IOPAMIDOL (ISOVUE-300) INJECTION 61%
100.0000 mL | Freq: Once | INTRAVENOUS | Status: AC | PRN
  Administered 2016-03-23: 100 mL via INTRAVENOUS

## 2016-03-23 MED ORDER — SODIUM CHLORIDE 0.9 % IV SOLN
1.0000 mg/h | INTRAVENOUS | Status: DC
  Administered 2016-03-23: 1 mg/h via INTRAVENOUS
  Administered 2016-03-24 (×3): 4 mg/h via INTRAVENOUS
  Administered 2016-03-24 – 2016-03-26 (×5): 8 mg/h via INTRAVENOUS
  Administered 2016-03-26: 4 mg/h via INTRAVENOUS
  Filled 2016-03-23 (×10): qty 10

## 2016-03-23 MED ORDER — MIDAZOLAM HCL 5 MG/5ML IJ SOLN
INTRAMUSCULAR | Status: AC | PRN
  Administered 2016-03-23: 2 mg via INTRAVENOUS

## 2016-03-23 MED ORDER — LORAZEPAM 2 MG/ML IJ SOLN
2.0000 mg | INTRAMUSCULAR | Status: DC | PRN
  Administered 2016-03-23: 2 mg via INTRAVENOUS
  Filled 2016-03-23: qty 1

## 2016-03-23 MED ORDER — ASPIRIN 300 MG RE SUPP
300.0000 mg | RECTAL | Status: AC
  Administered 2016-03-23: 300 mg via RECTAL

## 2016-03-23 MED ORDER — LORAZEPAM 2 MG/ML IJ SOLN
INTRAMUSCULAR | Status: AC
  Filled 2016-03-23: qty 1

## 2016-03-23 MED ORDER — SODIUM CHLORIDE 0.9 % IV SOLN: 1000.0000 mg | Freq: Two times a day (BID) | INTRAVENOUS | Status: DC

## 2016-03-23 MED ORDER — ASPIRIN 81 MG PO CHEW: 324.0000 mg | CHEWABLE_TABLET | ORAL | Status: DC

## 2016-03-23 MED ORDER — SODIUM CHLORIDE 0.9 % IV SOLN
INTRAVENOUS | Status: AC | PRN
  Administered 2016-03-23: 1000 mL via INTRAVENOUS

## 2016-03-23 MED ORDER — ASPIRIN 300 MG RE SUPP
RECTAL | Status: AC
  Administered 2016-03-23: 300 mg via RECTAL
  Filled 2016-03-23: qty 1

## 2016-03-23 MED ORDER — ASPIRIN 300 MG RE SUPP: 300.0000 mg | RECTAL | Status: DC

## 2016-03-23 MED ORDER — SODIUM CHLORIDE 0.9 % IV SOLN: 250.0000 mL | INTRAVENOUS | Status: DC | PRN

## 2016-03-23 MED ORDER — EPINEPHRINE PF 1 MG/10ML IJ SOSY
PREFILLED_SYRINGE | INTRAMUSCULAR | Status: AC | PRN
  Administered 2016-03-23 (×2): 1 mg via INTRAVENOUS

## 2016-03-23 MED ORDER — CHLORHEXIDINE GLUCONATE 0.12% ORAL RINSE (MEDLINE KIT): 15.0000 mL | Freq: Two times a day (BID) | OROMUCOSAL | Status: DC

## 2016-03-23 MED ORDER — SUCCINYLCHOLINE CHLORIDE 20 MG/ML IJ SOLN
120.0000 mg | Freq: Once | INTRAMUSCULAR | Status: AC
  Administered 2016-03-23: 120 mg via INTRAVENOUS

## 2016-03-23 NOTE — ED Notes (Signed)
Ladona Ridgelaylor RN notified of critical trop of 0.28
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
## 2016-03-23 NOTE — Progress Notes (Signed)
Spoke with MD Simmonds to inform that patient continue to exhibit myoclonic movements with interventions provided, becoming more frequent and temperature rising instead of cooling. Requested that central line be placed since medications needing to be infused were not compatible with one another. Also, informed MD Simmonds of initial elevated troponin level of 0.28.  MD Simmonds stated possibly placing a line tomorrow and to use IO cath placed in field for 24 hours and attempt more PIV's. No new orders for myoclonic activity at this time. No new orders for elevated troponin at this time.

## 2016-03-23 NOTE — Progress Notes (Signed)
eLink Physician-Brief Progress Note Patient Name: Nicholas CoeJames M Orf DOB: 11/04/1945 MRN: [id number]   Date of Service  03/25/2016  HPI/Events of Note  Ongoing myoclonus  eICU Interventions  D/w Dr Rod MaeSimonids - await neuro consult  -Rx ativan prn  - add valproic acid     Intervention Category Major Interventions: Seizures - evaluation and management  [name] 03/24/2016, 3:46 PM
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
## 2016-03-23 NOTE — ED Notes (Signed)
Dr. Shaune PollackLord notified of lactic acid of 13.8

## 2016-03-23 NOTE — Progress Notes (Signed)
MEDICATION RELATED CONSULT NOTE    Pharmacy Consult for Phenytoin/Keppra Dosing   Pharmacy consulted for phenytoin dosing for 70 yo male with history of seizure disorder maintained on Keppra 1500mg  PO BID and phenytoin 100mg  PO BID. Patient missed dose on Keppra on 12/21. Patient is now intubated and will be undergoing hypothermia protocol.   Plan:   1. Phenytoin: Patient received phenytoin 500mg  (~6.8mg /kg) IV x 1 in ED with resulting phenytoin level of 9.6. Will order additional phenytoin 250mg  to complete load. Will start patient on phenytoin 125mg  IV Q8hr with first dose at 2200 on 12/22. Unless otherwise clinically indicated, will obtain phenytoin (Goal 10-20) and albumin levels on 12/27 with am labs.    2. Keppra: Patient received Keppra 500mg  IV x 1 in ED. Will order additional Keppra 1000mg  to equal dose of 1500mg . Will start Keppra 1500mg  IV Q12hr with next scheduled dose on 12/22 at 2200.    No Known Allergies  Patient Measurements: Weight: 180 lb (81.6 kg) Adjusted Body Weight: 73.7kg   Vital Signs: Temp: 96.6 F (35.9 C) (12/22 1203) Temp Source: Rectal (12/22 1203) BP: 117/66 (12/22 1250) Pulse Rate: 86 (12/22 1250) Intake/Output from previous day: No intake/output data recorded. Intake/Output from this shift: No intake/output data recorded.  Labs:  Recent Labs  03/28/2016 1031  WBC 9.3  HGB 13.7  HCT 41.2  PLT 220  CREATININE 1.09  ALBUMIN 3.9  PROT 6.7  AST 137*  ALT 119*  ALKPHOS 82  BILITOT 0.6   Estimated Creatinine Clearance: 62.8 mL/min (by C-G formula based on SCr of 1.09 mg/dL).   Microbiology: Recent Results (from the past 720 hour(s))  Microscopic Examination     Status: Abnormal   Collection Time: 03/22/16 10:34 AM  Result Value Ref Range Status   WBC, UA 0-5 0 - 5 /hpf Final   RBC, UA 3-10 (A) 0 - 2 /hpf Final   Epithelial Cells (non renal) 0-10 0 - 10 /hpf Final   Crystals Present (A) N/A Final   Crystal Type Amorphous Sediment N/A  Final   Bacteria, UA Few None seen/Few Final    Pharmacy will continue to monitor and adjust per consult.    Simpson,Michael L 03/11/2016,1:01 PM

## 2016-03-23 NOTE — Procedures (Signed)
Central Venous Catheter Insertion Procedure Note Amado CoeJames M Carpino [id number] 08/16/1945  Procedure: Insertion of Central Venous Catheter Indications: Assessment of intravascular volume, Drug and/or fluid administration and Frequent blood sampling  Procedure Details Consent: Risks of procedure as well as the alternatives and risks of each were explained to the (patient/caregiver).  Consent for procedure obtained. Time Out: Verified patient identification, verified procedure, site/side was marked, verified correct patient position, special equipment/implants available, medications/allergies/relevent history reviewed, required imaging and test results available.  Performed  Maximum sterile technique was used including antiseptics, cap, gloves, gown, hand hygiene, mask and sheet. Skin prep: Chlorhexidine; local anesthetic administered A antimicrobial bonded/coated triple lumen catheter was placed in the right femoral vein due to multiple attempts, no other available access using the Seldinger technique.  Evaluation Blood flow good Complications: No apparent complications Patient did tolerate procedure well. Chest X-ray ordered to verify placement.  CXR: normal.  Placed right femoral central line utilizing ultrasound no complications noted during procedure.  Sonda Rumbleana Blakeney, AGNP  Pulmonary/Critical Care Pager [phone] (please enter 7 digits) PCCM Consult Pager [phone] (please enter 7 digits)  Billy Fischeravid Christalyn Goertz, MD PCCM service Mobile [phone] Pager [phone] 03/24/2016

## 2016-03-23 NOTE — Progress Notes (Signed)
Notified [employer] per [employer] policy. Reference number [id number], spoke with Cammy BrochureLatoshia Moore.  Will continue to monitor patient.
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
## 2016-03-23 NOTE — ED Notes (Signed)
Pt demonstrates signs of seizure, twitching, Dr. Shaune PollackLord made aware

## 2016-03-23 NOTE — ED Notes (Signed)
intensivist admitting at bedside

## 2016-03-23 NOTE — Progress Notes (Signed)
Informed Sonda Rumbleana Blakeney, NP of troponin of .93.  Acknowledged.

## 2016-03-23 NOTE — ED Notes (Signed)
EEG at bedside.
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
## 2016-03-23 NOTE — ED Notes (Signed)
Wife arrives, Dr. Shaune PollackLord to update on pt status

## 2016-03-24 ENCOUNTER — Inpatient Hospital Stay
Admit: 2016-03-24 | Discharge: 2016-03-24 | Disposition: A | Discharge status: Home / Self Care | Payer: 59 | Attending: Pulmonary Disease | Admitting: Pulmonary Disease

## 2016-03-24 ENCOUNTER — Inpatient Hospital Stay: Payer: 59

## 2016-03-24 DIAGNOSIS — N179 Acute kidney failure, unspecified: Secondary | ICD-10-CM

## 2016-03-24 DIAGNOSIS — G253 Myoclonus: Secondary | ICD-10-CM

## 2016-03-24 LAB — URINE DRUG SCREEN, QUALITATIVE (ARMC ONLY)
AMPHETAMINES, UR SCREEN: NOT DETECTED
Barbiturates, Ur Screen: NOT DETECTED
Benzodiazepine, Ur Scrn: POSITIVE — AB
COCAINE METABOLITE, UR ~~LOC~~: NOT DETECTED
Cannabinoid 50 Ng, Ur ~~LOC~~: POSITIVE — AB
MDMA (ECSTASY) UR SCREEN: NOT DETECTED
METHADONE SCREEN, URINE: NOT DETECTED
Opiate, Ur Screen: NOT DETECTED
Phencyclidine (PCP) Ur S: NOT DETECTED
TRICYCLIC, UR SCREEN: NOT DETECTED

## 2016-03-24 LAB — CBC
HCT: 38.9 % — ABNORMAL LOW (ref 40.0–52.0)
HEMOGLOBIN: 13.4 g/dL (ref 13.0–18.0)
MCH: 31.9 pg (ref 26.0–34.0)
MCHC: 34.4 g/dL (ref 32.0–36.0)
MCV: 92.5 fL (ref 80.0–100.0)
PLATELETS: 177 10*3/uL (ref 150–440)
RBC: 4.2 MIL/uL — AB (ref 4.40–5.90)
RDW: 13.4 % (ref 11.5–14.5)
WBC: 12.9 10*3/uL — AB (ref 3.8–10.6)

## 2016-03-24 LAB — BASIC METABOLIC PANEL
ANION GAP: 9 (ref 5–15)
Anion gap: 12 (ref 5–15)
BUN: 16 mg/dL (ref 6–20)
BUN: 16 mg/dL (ref 6–20)
CHLORIDE: 101 mmol/L (ref 101–111)
CHLORIDE: 101 mmol/L (ref 101–111)
CO2: 20 mmol/L — ABNORMAL LOW (ref 22–32)
CO2: 22 mmol/L (ref 22–32)
Calcium: 7.5 mg/dL — ABNORMAL LOW (ref 8.9–10.3)
Calcium: 7.6 mg/dL — ABNORMAL LOW (ref 8.9–10.3)
Creatinine, Ser: 0.65 mg/dL (ref 0.61–1.24)
Creatinine, Ser: 0.66 mg/dL (ref 0.61–1.24)
GFR calc Af Amer: >60 mL/min (ref >60)
GFR calc non Af Amer: >60 mL/min (ref >60)
GLUCOSE: 115 mg/dL — AB (ref 65–99)
Glucose, Bld: 124 mg/dL — ABNORMAL HIGH (ref 65–99)
POTASSIUM: 3.5 mmol/L (ref 3.5–5.1)
POTASSIUM: 3.6 mmol/L (ref 3.5–5.1)
Sodium: 132 mmol/L — ABNORMAL LOW (ref 135–145)
Sodium: 133 mmol/L — ABNORMAL LOW (ref 135–145)

## 2016-03-24 LAB — GLUCOSE, CAPILLARY
GLUCOSE-CAPILLARY: 99 mg/dL (ref 65–99)
GLUCOSE-CAPILLARY: 92 mg/dL (ref 65–99)
GLUCOSE-CAPILLARY: 76 mg/dL (ref 65–99)
GLUCOSE-CAPILLARY: 94 mg/dL (ref 65–99)
Glucose-Capillary: 126 mg/dL — ABNORMAL HIGH (ref 65–99)

## 2016-03-24 LAB — TROPONIN I: Troponin I: 0.9 ng/mL (ref <0.03)

## 2016-03-24 MED ORDER — VITAL HIGH PROTEIN PO LIQD
1000.0000 mL | ORAL | Status: DC
  Administered 2016-03-24 (×2)
  Administered 2016-03-24: 1000 mL

## 2016-03-24 MED ORDER — PRO-STAT SUGAR FREE PO LIQD
60.0000 mL | Freq: Two times a day (BID) | ORAL | Status: DC
  Administered 2016-03-24 (×2): 60 mL via ORAL
  Administered 2016-03-25: 21:00:00 via ORAL
  Administered 2016-03-25: 60 mL via ORAL

## 2016-03-24 MED ORDER — IPRATROPIUM-ALBUTEROL 0.5-2.5 (3) MG/3ML IN SOLN
3.0000 mL | Freq: Four times a day (QID) | RESPIRATORY_TRACT | Status: DC
  Administered 2016-03-24 – 2016-03-26 (×9): 3 mL via RESPIRATORY_TRACT
  Filled 2016-03-24 (×9): qty 3

## 2016-03-24 MED ORDER — SODIUM CHLORIDE 0.9% FLUSH: 10.0000 mL | INTRAVENOUS | Status: DC | PRN

## 2016-03-24 MED ORDER — FENTANYL CITRATE (PF) 100 MCG/2ML IJ SOLN
INTRAMUSCULAR | Status: AC
  Administered 2016-03-24: 100 ug via INTRAVENOUS
  Filled 2016-03-24: qty 2

## 2016-03-24 MED ORDER — AMPICILLIN-SULBACTAM SODIUM 3 (2-1) G IJ SOLR
3.0000 g | Freq: Four times a day (QID) | INTRAMUSCULAR | Status: DC
  Administered 2016-03-24 – 2016-03-26 (×8): 3 g via INTRAVENOUS
  Filled 2016-03-24 (×10): qty 3

## 2016-03-24 MED ORDER — SODIUM CHLORIDE 0.9% FLUSH
10.0000 mL | Freq: Two times a day (BID) | INTRAVENOUS | Status: DC
  Administered 2016-03-24 (×2): 10 mL
  Administered 2016-03-24: 40 mL
  Administered 2016-03-25: 10 mL
  Administered 2016-03-25: 20 mL
  Administered 2016-03-26: 10 mL

## 2016-03-24 MED ORDER — FENTANYL CITRATE (PF) 100 MCG/2ML IJ SOLN
100.0000 ug | Freq: Once | INTRAMUSCULAR | Status: AC
  Administered 2016-03-24: 100 ug via INTRAVENOUS

## 2016-03-24 MED ORDER — SODIUM CHLORIDE 0.9% FLUSH
10.0000 mL | Freq: Two times a day (BID) | INTRAVENOUS | Status: DC
  Administered 2016-03-24 – 2016-03-25 (×2): 10 mL
  Administered 2016-03-25: 20 mL

## 2016-03-24 MED FILL — Medication: Qty: 1 | Status: AC

## 2016-03-24 NOTE — Progress Notes (Signed)
Target core temp at 36 degrees

## 2016-03-24 NOTE — Progress Notes (Signed)
Continues to have episodes of jerking from  head, shoulders and bilateral arms.  Negative gag and corneal reflex.  Does breath over set vent rate.  Versed and propofol titrated down, but increased after episodes of jerking. SR on monitor noted. No ectopy.  Urine output good.

## 2016-03-24 NOTE — Progress Notes (Signed)
Notified MD Simonds that patient had an episode of hypoxia (86%) but with suction had come back up to high 90%, patient had also become tachypneic and lung sounds were ronchi instead of clear lung sounds auscultated previously. See new orders, will continue to monitor patient.
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
## 2016-03-24 NOTE — Progress Notes (Signed)
MEDICATION RELATED CONSULT NOTE    Pharmacy Consult for Phenytoin/Keppra Dosing   Pharmacy consulted for phenytoin dosing for 70 yo male with history of seizure disorder maintained on Keppra 1500mg  PO BID and phenytoin 100mg  PO BID. Patient missed dose on Keppra on 12/21. Patient is now intubated and will be undergoing hypothermia protocol.   Plan:   1. Phenytoin: Patient received phenytoin 500mg  (~6.8mg /kg) IV x 1 in ED with resulting phenytoin level of 9.6. Will order additional phenytoin 250mg  to complete load. Will start patient on phenytoin 125mg  IV Q8hr with first dose at 2200 on 12/22. Unless otherwise clinically indicated, will obtain phenytoin (Goal 10-20) and albumin levels on 12/27 with am labs.    2. Keppra: Patient received Keppra 500mg  IV x 1 in ED. Will order additional Keppra 1000mg  to equal dose of 1500mg . Will start Keppra 1500mg  IV Q12hr with next scheduled dose on 12/22 at 2200.     Notes patient also on Depacon.   No Known Allergies  Patient Measurements: Weight: 179 lb 14.3 oz (81.6 kg) Adjusted Body Weight: 73.7kg   Vital Signs: Temp: 96.8 F (36 C) (12/23 1500) Temp Source: Rectal (12/23 1500) BP: 133/67 (12/23 1500) Pulse Rate: 82 (12/23 1500) Intake/Output from previous day: 12/22 0701 - 12/23 0700 In: 2435 [I.V.:1817.5; IV Piggyback:617.5] Out: 2860 [Urine:2760; Emesis/NG output:100] Intake/Output from this shift: Total I/O In: 996 [I.V.:761.6; NG/GT:14.3; IV Piggyback:220] Out: 325 [Urine:325]  Labs:  Recent Labs  12-16-15 1031 12-16-15 1333 03/24/16 0010 03/24/16 0518  WBC 9.3  --   --  12.9*  HGB 13.7  --   --  13.4  HCT 41.2  --   --  38.9*  PLT 220  --   --  177  APTT  --  32  --   --   CREATININE 1.09 0.97 0.66 0.65  ALBUMIN 3.9  --   --   --   PROT 6.7  --   --   --   AST 137*  --   --   --   ALT 119*  --   --   --   ALKPHOS 82  --   --   --   BILITOT 0.6  --   --   --    Estimated Creatinine Clearance: 85.5 mL/min (by C-G  formula based on SCr of 0.65 mg/dL).   Microbiology: Recent Results (from the past 720 hour(s))  Microscopic Examination     Status: Abnormal   Collection Time: 03/22/16 10:34 AM  Result Value Ref Range Status   WBC, UA 0-5 0 - 5 /hpf Final   RBC, UA 3-10 (A) 0 - 2 /hpf Final   Epithelial Cells (non renal) 0-10 0 - 10 /hpf Final   Crystals Present (A) N/A Final   Crystal Type Amorphous Sediment N/A Final   Bacteria, UA Few None seen/Few Final  MRSA PCR Screening     Status: None   Collection Time: 12-16-15  3:45 PM  Result Value Ref Range Status   MRSA by PCR NEGATIVE NEGATIVE Final    Comment:        The GeneXpert MRSA Assay (FDA approved for NASAL specimens only), is one component of a comprehensive MRSA colonization surveillance program. It is not intended to diagnose MRSA infection nor to guide or monitor treatment for MRSA infections.     Pharmacy will continue to monitor and adjust per consult.    Cynthya Yam A 03/24/2016,3:14 PM

## 2016-03-24 NOTE — Progress Notes (Signed)
Heart rate increased to 130's,  hypertensive and resp 39.  Informed Sonda Rumbleana Blakeney, NP.  Fentanyl 100mcg ordered and given.  Resp and b/p improved.  Heart rate remains in low 120's.  Will continue to monitor.
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
## 2016-03-24 NOTE — Progress Notes (Signed)
MEDICATION RELATED CONSULT NOTE    Pharmacy Consult for Phenytoin/Keppra/ Unasyn Dosing   Pharmacy consulted for phenytoin and Unasyn dosing for 70 yo male with history of seizure disorder maintained on Keppra 1500mg  PO BID and phenytoin 100mg  PO BID. Patient missed dose on Keppra on 12/21. Patient is now intubated and will be undergoing hypothermia protocol.   Plan:   1. Phenytoin: Patient received phenytoin 500mg  (~6.8mg /kg) IV x 1 in ED with resulting phenytoin level of 9.6. Will order additional phenytoin 250mg  to complete load. Will start patient on phenytoin 125mg  IV Q8hr with first dose at 2200 on 12/22. Unless otherwise clinically indicated, will obtain phenytoin (Goal 10-20) and albumin levels on 12/27 with am labs.    2. Keppra: Patient received Keppra 500mg  IV x 1 in ED. Will order additional Keppra 1000mg  to equal dose of 1500mg . Will start Keppra 1500mg  IV Q12hr with next scheduled dose on 12/22 at 2200.     Notes patient also on Depacon.  3. Unasyn: Patient with aspiration PNA. Will order Unasyn 3g Q6h.   No Known Allergies  Patient Measurements: Weight: 179 lb 14.3 oz (81.6 kg) Adjusted Body Weight: 73.7kg   Vital Signs: Temp: 96.8 F (36 C) (12/23 1500) Temp Source: Rectal (12/23 1500) BP: 133/67 (12/23 1500) Pulse Rate: 82 (12/23 1500) Intake/Output from previous day: 12/22 0701 - 12/23 0700 In: 2435 [I.V.:1817.5; IV Piggyback:617.5] Out: 2860 [Urine:2760; Emesis/NG output:100] Intake/Output from this shift: Total I/O In: 996 [I.V.:761.6; NG/GT:14.3; IV Piggyback:220] Out: 325 [Urine:325]  Labs:  Recent Labs  04-13-15 1031 04-13-15 1333 03/24/16 0010 03/24/16 0518  WBC 9.3  --   --  12.9*  HGB 13.7  --   --  13.4  HCT 41.2  --   --  38.9*  PLT 220  --   --  177  APTT  --  32  --   --   CREATININE 1.09 0.97 0.66 0.65  ALBUMIN 3.9  --   --   --   PROT 6.7  --   --   --   AST 137*  --   --   --   ALT 119*  --   --   --   ALKPHOS 82  --   --   --    BILITOT 0.6  --   --   --    Estimated Creatinine Clearance: 85.5 mL/min (by C-G formula based on SCr of 0.65 mg/dL).   Microbiology: Recent Results (from the past 720 hour(s))  Microscopic Examination     Status: Abnormal   Collection Time: 03/22/16 10:34 AM  Result Value Ref Range Status   WBC, UA 0-5 0 - 5 /hpf Final   RBC, UA 3-10 (A) 0 - 2 /hpf Final   Epithelial Cells (non renal) 0-10 0 - 10 /hpf Final   Crystals Present (A) N/A Final   Crystal Type Amorphous Sediment N/A Final   Bacteria, UA Few None seen/Few Final  MRSA PCR Screening     Status: None   Collection Time: 04-13-15  3:45 PM  Result Value Ref Range Status   MRSA by PCR NEGATIVE NEGATIVE Final    Comment:        The GeneXpert MRSA Assay (FDA approved for NASAL specimens only), is one component of a comprehensive MRSA colonization surveillance program. It is not intended to diagnose MRSA infection nor to guide or monitor treatment for MRSA infections.     Pharmacy will continue to monitor and adjust per  consult.    Delsa BernKelly m Kawanda Drumheller, PharmD 03/24/2016,4:06 PM

## 2016-03-25 ENCOUNTER — Inpatient Hospital Stay: Payer: 59

## 2016-03-25 DIAGNOSIS — J9600 Acute respiratory failure, unspecified whether with hypoxia or hypercapnia: Secondary | ICD-10-CM

## 2016-03-25 DIAGNOSIS — J9690 Respiratory failure, unspecified, unspecified whether with hypoxia or hypercapnia: Secondary | ICD-10-CM

## 2016-03-25 DIAGNOSIS — J690 Pneumonitis due to inhalation of food and vomit: Secondary | ICD-10-CM

## 2016-03-25 DIAGNOSIS — G253 Myoclonus: Secondary | ICD-10-CM

## 2016-03-25 DIAGNOSIS — R0902 Hypoxemia: Secondary | ICD-10-CM

## 2016-03-25 LAB — CBC
HEMATOCRIT: 35.4 % — AB (ref 40.0–52.0)
Hemoglobin: 12.5 g/dL — ABNORMAL LOW (ref 13.0–18.0)
MCH: 32.5 pg (ref 26.0–34.0)
MCHC: 35.3 g/dL (ref 32.0–36.0)
MCV: 91.9 fL (ref 80.0–100.0)
Platelets: 133 10*3/uL — ABNORMAL LOW (ref 150–440)
RBC: 3.85 MIL/uL — ABNORMAL LOW (ref 4.40–5.90)
RDW: 13.4 % (ref 11.5–14.5)
WBC: 3 10*3/uL — AB (ref 3.8–10.6)

## 2016-03-25 LAB — COMPREHENSIVE METABOLIC PANEL
ALBUMIN: 3 g/dL — AB (ref 3.5–5.0)
ALT: 81 U/L — ABNORMAL HIGH (ref 17–63)
ANION GAP: 8 (ref 5–15)
AST: 109 U/L — AB (ref 15–41)
Alkaline Phosphatase: 54 U/L (ref 38–126)
BUN: 15 mg/dL (ref 6–20)
CHLORIDE: 102 mmol/L (ref 101–111)
CO2: 21 mmol/L — AB (ref 22–32)
Calcium: 7.5 mg/dL — ABNORMAL LOW (ref 8.9–10.3)
Creatinine, Ser: 0.73 mg/dL (ref 0.61–1.24)
GFR calc Af Amer: >60 mL/min (ref >60)
GFR calc non Af Amer: >60 mL/min (ref >60)
GLUCOSE: 93 mg/dL (ref 65–99)
POTASSIUM: 2.7 mmol/L — AB (ref 3.5–5.1)
SODIUM: 131 mmol/L — AB (ref 135–145)
Total Protein: 5.3 g/dL — ABNORMAL LOW (ref 6.5–8.1)

## 2016-03-25 LAB — GLUCOSE, CAPILLARY
GLUCOSE-CAPILLARY: 92 mg/dL (ref 65–99)
Glucose-Capillary: 94 mg/dL (ref 65–99)
Glucose-Capillary: 89 mg/dL (ref 65–99)
Glucose-Capillary: 90 mg/dL (ref 65–99)

## 2016-03-25 LAB — ECHOCARDIOGRAM COMPLETE: Weight: 2878.33 oz

## 2016-03-25 MED ORDER — METOPROLOL TARTRATE 5 MG/5ML IV SOLN
INTRAVENOUS | Status: AC
  Administered 2016-03-25: 5 mg via INTRAVENOUS
  Filled 2016-03-25: qty 5

## 2016-03-25 MED ORDER — FENTANYL CITRATE (PF) 100 MCG/2ML IJ SOLN
INTRAMUSCULAR | Status: AC
  Administered 2016-03-25: 100 ug via INTRAVENOUS
  Filled 2016-03-25: qty 2

## 2016-03-25 MED ORDER — ACETAMINOPHEN 325 MG PO TABS: 650.0000 mg | ORAL_TABLET | Freq: Four times a day (QID) | ORAL | Status: DC | PRN

## 2016-03-25 MED ORDER — VITAL HIGH PROTEIN PO LIQD
1000.0000 mL | ORAL | Status: DC
  Administered 2016-03-25: 17:00:00
  Administered 2016-03-25: 1000 mL

## 2016-03-25 MED ORDER — POTASSIUM CHLORIDE 2 MEQ/ML IV SOLN
INTRAVENOUS | Status: DC
  Administered 2016-03-25: 08:00:00 via INTRAVENOUS
  Filled 2016-03-25: qty 1000

## 2016-03-25 MED ORDER — METOPROLOL TARTRATE 5 MG/5ML IV SOLN
5.0000 mg | Freq: Four times a day (QID) | INTRAVENOUS | Status: DC | PRN
  Administered 2016-03-25: 5 mg via INTRAVENOUS

## 2016-03-25 MED ORDER — ACETAMINOPHEN 160 MG/5ML PO SUSP
650.0000 mg | Freq: Four times a day (QID) | ORAL | Status: DC | PRN
  Administered 2016-03-25: 650 mg
  Filled 2016-03-25 (×2): qty 20.3

## 2016-03-25 MED ORDER — FENTANYL CITRATE (PF) 100 MCG/2ML IJ SOLN
100.0000 ug | INTRAMUSCULAR | Status: DC | PRN
  Administered 2016-03-25 (×4): 100 ug via INTRAVENOUS
  Filled 2016-03-25 (×3): qty 2

## 2016-03-25 MED ORDER — POTASSIUM CHLORIDE 20 MEQ/15ML (10%) PO SOLN
40.0000 meq | Freq: Two times a day (BID) | ORAL | Status: DC
  Administered 2016-03-25 (×2): 40 meq
  Filled 2016-03-25 (×3): qty 30

## 2016-03-25 NOTE — Progress Notes (Signed)
Neurology:  Discussion with pt's wife over the phone.  CT imaging reviewed and discussed.  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   It was made clear to family that pt will have very poor neurological prognosis for any recovery let along meaningful recovery.   Pt will be DNR. I would suspect withdrawal of care in the next few days.  Wife is the decision maker at [phone] but there are kids involved as well.  [name], Nicholas Rowe

## 2016-03-25 NOTE — Progress Notes (Signed)
Pt care assumed at this time.  [facility] from [facility].

## 2016-03-25 NOTE — Progress Notes (Signed)
Neurology:  Attempted to call pt's wife at [phone] but she did not answer will attempt to call again later Pt's myoclonic activity improved and he is on fentanyl PRN  - Will obtain another CTH today to assist with prognostication and will follow  [name], Ardyn Forge
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
## 2016-03-25 NOTE — Progress Notes (Signed)
Informed Dr. Bea LauraE. Deterding with [facility] of patient temp of 99.9.  Tylenol ordered

## 2016-03-25 NOTE — Progress Notes (Signed)
VSS improving

## 2016-03-25 NOTE — Progress Notes (Signed)
Critical potassium called to Dr. Darrick Pennaeterding.  Will not replace at this time due to still in warming process.

## 2016-03-25 NOTE — Progress Notes (Signed)
Family visited patient, daughter states "We don't want him to lay here if theres no hope for recovery."  Patients wife to speak with Dr. Sung AmabileSimonds by phone.

## 2016-03-25 NOTE — Progress Notes (Signed)
Heart rate 130's, resp upper 30's and hypertensive.  Informed Murvin Natal. Blakeney, NP.  Metoprolol and Fentanyl ordered.

## 2016-03-26 ENCOUNTER — Inpatient Hospital Stay: Payer: 59

## 2016-03-26 LAB — BASIC METABOLIC PANEL
Anion gap: 6 (ref 5–15)
BUN: 12 mg/dL (ref 6–20)
CALCIUM: 7.5 mg/dL — AB (ref 8.9–10.3)
CHLORIDE: 104 mmol/L (ref 101–111)
CO2: 23 mmol/L (ref 22–32)
CREATININE: 0.66 mg/dL (ref 0.61–1.24)
GFR calc Af Amer: >60 mL/min (ref >60)
GFR calc non Af Amer: >60 mL/min (ref >60)
GLUCOSE: 105 mg/dL — AB (ref 65–99)
Potassium: 3.1 mmol/L — ABNORMAL LOW (ref 3.5–5.1)
Sodium: 133 mmol/L — ABNORMAL LOW (ref 135–145)

## 2016-03-26 LAB — GLUCOSE, CAPILLARY
Glucose-Capillary: 106 mg/dL — ABNORMAL HIGH (ref 65–99)
Glucose-Capillary: 97 mg/dL (ref 65–99)

## 2016-03-26 LAB — CBC
HCT: 31.3 % — ABNORMAL LOW (ref 40.0–52.0)
Hemoglobin: 11 g/dL — ABNORMAL LOW (ref 13.0–18.0)
MCH: 32.1 pg (ref 26.0–34.0)
MCHC: 35 g/dL (ref 32.0–36.0)
MCV: 91.6 fL (ref 80.0–100.0)
PLATELETS: 128 10*3/uL — AB (ref 150–440)
RBC: 3.41 MIL/uL — ABNORMAL LOW (ref 4.40–5.90)
RDW: 13.9 % (ref 11.5–14.5)
WBC: 6 10*3/uL (ref 3.8–10.6)

## 2016-03-26 LAB — TRIGLYCERIDES: TRIGLYCERIDES: 554 mg/dL — AB (ref <150)

## 2016-03-26 MED ORDER — ACETAMINOPHEN 160 MG/5ML PO SOLN
650.0000 mg | Freq: Four times a day (QID) | ORAL | Status: DC
  Administered 2016-03-26: 650 mg
  Filled 2016-03-26 (×3): qty 20.3

## 2016-03-26 MED ORDER — POTASSIUM CHLORIDE 20 MEQ/15ML (10%) PO SOLN
40.0000 meq | Freq: Two times a day (BID) | ORAL | Status: DC
  Administered 2016-03-26: 40 meq
  Filled 2016-03-26 (×2): qty 30

## 2016-03-26 MED ORDER — VITAL HIGH PROTEIN PO LIQD
1000.0000 mL | ORAL | Status: DC
  Administered 2016-03-26: 1000 mL

## 2016-03-26 MED ORDER — METOPROLOL TARTRATE 5 MG/5ML IV SOLN: 2.5000 mg | INTRAVENOUS | Status: DC | PRN

## 2016-03-26 MED ORDER — LORAZEPAM 2 MG/ML IJ SOLN: 2.0000 mg | INTRAMUSCULAR | Status: DC | PRN

## 2016-03-26 MED ORDER — BUSPIRONE HCL 5 MG PO TABS
30.0000 mg | ORAL_TABLET | Freq: Four times a day (QID) | ORAL | Status: DC
  Administered 2016-03-26: 30 mg via NASOGASTRIC
  Filled 2016-03-26: qty 6

## 2016-03-26 MED ORDER — MORPHINE 100MG IN NS 100ML (1MG/ML) PREMIX INFUSION
10.0000 mg/h | INTRAVENOUS | Status: DC
  Administered 2016-03-26 (×2): 10 mg/h via INTRAVENOUS
  Filled 2016-03-26 (×2): qty 100

## 2016-03-26 MED ORDER — MORPHINE BOLUS VIA INFUSION
5.0000 mg | INTRAVENOUS | Status: DC | PRN
  Administered 2016-03-26: 10 mg via INTRAVENOUS
  Administered 2016-03-26: 12.5 mg via INTRAVENOUS
  Filled 2016-03-26: qty 20

## 2016-03-27 LAB — CULTURE, RESPIRATORY W GRAM STAIN

## 2016-03-27 LAB — CULTURE, RESPIRATORY

## 2016-03-28 LAB — GLUCOSE, CAPILLARY: Glucose-Capillary: 117 mg/dL — ABNORMAL HIGH (ref 65–99)

## 2016-03-30 ENCOUNTER — Telehealth: Payer: Self-pay

## 2016-03-30 NOTE — Telephone Encounter (Signed)
Received Death Certificate from [facility]. Placed in Dr. Jones BroomSimonds inbasket folder.  Please call [facility] when ready for pick up

## 2016-03-30 NOTE — Telephone Encounter (Signed)
Will forward this message to DS to make him aware of death certificate has been placed in your look at folder---we will need to call [facility] once this has been completed.

## 2016-04-02 NOTE — Progress Notes (Signed)
Work of breathing improved.  Patient more comfortable.  Continuing to monitor.

## 2016-04-02 NOTE — Progress Notes (Signed)
Pt passed away at 21:19.  This rn was present in the room with the pt.  Pt passed peacefully.  Two nurses pronounced death, Holley DexterRenee Amaad Byers, rn and [employer]eth Buono, rn, at 21:19. There wasn't any family present.  The chaplain was notified for prayer for the pt. [facility] was notified of pt. passing. Per Marco CollieVicki Bemer at [facility], pt. possible candidate for tissue donation.  Reference number is [id number].  Wife, Chanda Businglaine Marquis, notified of pt. passing.  She is unable to come back to the hospital at this time.  Per spouse, she does not have any arrangements made at this time.  I made Mrs. Tuft aware that the nursing supervisor would possibly call her for more information.  I then called Alicia,rn who is the nursing supervisor to make her aware of Mr.Mankin's passing and also made her aware that the spouse had been notified as well as [facility].

## 2016-04-02 NOTE — Progress Notes (Signed)
Patient vomited orange colored liquid.  Patient just was given potassium and tylenol via OG tube.  Tube feeds stopped and OG hooked to suction.  Will notify MD.

## 2016-04-02 NOTE — Progress Notes (Signed)
Patient extubated to 2l [place] for comfort care. [place]ada[place]ittany RN at bedside.

## 2016-04-02 NOTE — Progress Notes (Signed)
[facility] Physician-Brief Progress Note Patient Name: Nicholas Rowe DOB: 01/15/1946 MRN: [id number]   Date of Service  Sep 27, 2015  HPI/Events of Note  100cc vomit per RN,.  Belly soft per RN  eICU Interventions  Hold TF Get AXR in aM     Intervention Category Major Interventions: Other:  Roya Gieselman Sep 27, 2015, 12:06 AM

## 2016-04-02 NOTE — Progress Notes (Signed)
Pt vomitted small amount ( ) of tube feed Tube feeds on hold as per MD . VSS.Pt remains  Sedated with  Propofol.
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
## 2016-04-02 NOTE — Progress Notes (Signed)
[facility] to visit the Pt in ICU Rm 20 again at 9:17. Pt expired, the family not present, CH offered prayers for the two Nurses present.     18-Sep-2015 2100  Clinical Encounter Type  Visited With Patient;Health care provider  Visit Type Follow-up;Death  Referral From Nurse  Consult/Referral To Chaplain  Spiritual Encounters  Spiritual Needs Emotional;Grief support
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
## 2016-04-02 NOTE — Progress Notes (Signed)
Extubated at 1547 to 2L nasal cannula.  Comfort measures in place. Morphine drip infusing.  Snoring respirations with labored breathing therefore morphine bolus via infusion given. This RN and Staci, RN Geanie Berlinalong with Liam Rogershaplain Fred at bedside with patient.  Continuing to monitor.

## 2016-04-02 NOTE — Progress Notes (Signed)
Patients ET tube pulled back 2cm to 23cm at the lip, per Dr. Sung AmabileSimonds order.

## 2016-04-02 NOTE — Significant Event (Signed)
I spoke with pt's wife over phone. We reviewed the hospital course and current status in detail. We reviewed the poor prognosis for functional neurological recovery and discussed options of care. She is certain that he would not wish to survive in a markedly debilitated state. She has discussed with his other family members and loved ones and they are all in agreement. She wishes for withdrawal of vent support and full comfort measures. She and the other loved ones do not wish to be here as they have all said their good byes  Withdrawal protocol has been ordered  Billy Fischeravid Roseanne Juenger, MD PCCM service Mobile [phone] Pager [phone] 07/06/15

## 2016-04-02 NOTE — Progress Notes (Signed)
Snoring respirations and labored after repositioning therefore bolus of morphine given and drip increased.

## 2016-04-02 NOTE — Progress Notes (Signed)
RN made Dr. Sung AmabileSimonds aware that patient vomited orange liquid that appeared to be tylenol and potassium.  RN made MD aware that OG tube is hooked to suction at this time.  MD acknowledged giving no new orders at this time.

## 2016-04-02 DEATH — deceased

## 2017-03-22 ENCOUNTER — Ambulatory Visit: Payer: 59
